# Patient Record
Sex: Female | Born: 1935 | Race: White | Hispanic: No | Smoking: Never smoker
Health system: Southern US, Community
[De-identification: ages and names within clinical notes are randomized; demographics above are authoritative.]

## PROBLEM LIST (undated history)

## (undated) DIAGNOSIS — I10 Essential (primary) hypertension: Secondary | ICD-10-CM

## (undated) DIAGNOSIS — G930 Cerebral cysts: Secondary | ICD-10-CM

## (undated) DIAGNOSIS — I509 Heart failure, unspecified: Secondary | ICD-10-CM

## (undated) DIAGNOSIS — I2510 Atherosclerotic heart disease of native coronary artery without angina pectoris: Secondary | ICD-10-CM

## (undated) DIAGNOSIS — I340 Nonrheumatic mitral (valve) insufficiency: Secondary | ICD-10-CM

## (undated) DIAGNOSIS — R0989 Other specified symptoms and signs involving the circulatory and respiratory systems: Secondary | ICD-10-CM

## (undated) DIAGNOSIS — E039 Hypothyroidism, unspecified: Secondary | ICD-10-CM

## (undated) HISTORY — DX: Hypothyroidism, unspecified: E03.9

## (undated) HISTORY — DX: Other specified symptoms and signs involving the circulatory and respiratory systems: R09.89

## (undated) HISTORY — PX: CRANIOTOMY: SHX93

## (undated) HISTORY — DX: Atherosclerotic heart disease of native coronary artery without angina pectoris: I25.10

## (undated) HISTORY — DX: Nonrheumatic mitral (valve) insufficiency: I34.0

## (undated) HISTORY — DX: Cerebral cysts: G93.0

## (undated) HISTORY — DX: Essential (primary) hypertension: I10

## (undated) HISTORY — DX: Heart failure, unspecified: I50.9

---

## 1998-06-03 ENCOUNTER — Other Ambulatory Visit: Admission: RE | Admit: 1998-06-03 | Discharge: 1998-06-03 | Payer: Self-pay | Admitting: Obstetrics and Gynecology

## 1999-02-05 ENCOUNTER — Encounter: Admission: RE | Admit: 1999-02-05 | Discharge: 1999-05-06 | Payer: Self-pay | Admitting: Internal Medicine

## 1999-07-14 ENCOUNTER — Other Ambulatory Visit: Admission: RE | Admit: 1999-07-14 | Discharge: 1999-07-14 | Payer: Self-pay | Admitting: Obstetrics and Gynecology

## 1999-09-03 ENCOUNTER — Encounter: Payer: Self-pay | Admitting: Obstetrics and Gynecology

## 1999-09-08 ENCOUNTER — Inpatient Hospital Stay (HOSPITAL_COMMUNITY): Admission: RE | Admit: 1999-09-08 | Discharge: 1999-09-10 | Payer: Self-pay | Admitting: Obstetrics and Gynecology

## 2001-12-01 ENCOUNTER — Other Ambulatory Visit: Admission: RE | Admit: 2001-12-01 | Discharge: 2001-12-01 | Payer: Self-pay | Admitting: Obstetrics & Gynecology

## 2002-12-19 ENCOUNTER — Other Ambulatory Visit: Admission: RE | Admit: 2002-12-19 | Discharge: 2002-12-19 | Payer: Self-pay | Admitting: Obstetrics and Gynecology

## 2004-01-02 ENCOUNTER — Encounter: Payer: Self-pay | Admitting: Internal Medicine

## 2004-10-31 ENCOUNTER — Encounter: Payer: Self-pay | Admitting: Internal Medicine

## 2005-07-16 ENCOUNTER — Encounter: Payer: Self-pay | Admitting: Internal Medicine

## 2005-08-18 ENCOUNTER — Encounter: Payer: Self-pay | Admitting: Internal Medicine

## 2006-01-08 ENCOUNTER — Encounter: Payer: Self-pay | Admitting: Internal Medicine

## 2007-03-16 ENCOUNTER — Encounter: Payer: Self-pay | Admitting: Internal Medicine

## 2007-03-23 ENCOUNTER — Encounter: Payer: Self-pay | Admitting: Internal Medicine

## 2007-08-19 ENCOUNTER — Encounter: Payer: Self-pay | Admitting: Internal Medicine

## 2007-08-26 ENCOUNTER — Encounter: Payer: Self-pay | Admitting: Internal Medicine

## 2007-08-26 ENCOUNTER — Encounter: Admission: RE | Admit: 2007-08-26 | Discharge: 2007-08-26 | Payer: Self-pay | Admitting: Internal Medicine

## 2007-08-31 ENCOUNTER — Encounter: Payer: Self-pay | Admitting: Internal Medicine

## 2007-09-28 ENCOUNTER — Inpatient Hospital Stay (HOSPITAL_COMMUNITY): Admission: RE | Admit: 2007-09-28 | Discharge: 2007-10-05 | Payer: Self-pay | Admitting: Neurological Surgery

## 2007-10-04 ENCOUNTER — Ambulatory Visit: Payer: Self-pay | Admitting: Physical Medicine & Rehabilitation

## 2007-10-05 ENCOUNTER — Inpatient Hospital Stay (HOSPITAL_COMMUNITY)
Admission: RE | Admit: 2007-10-05 | Discharge: 2007-10-25 | Payer: Self-pay | Admitting: Physical Medicine & Rehabilitation

## 2007-10-25 ENCOUNTER — Inpatient Hospital Stay (HOSPITAL_COMMUNITY): Admission: RE | Admit: 2007-10-25 | Discharge: 2007-10-31 | Payer: Self-pay | Admitting: Neurological Surgery

## 2007-11-24 ENCOUNTER — Encounter
Admission: RE | Admit: 2007-11-24 | Discharge: 2007-12-23 | Payer: Self-pay | Admitting: Physical Medicine & Rehabilitation

## 2007-11-25 ENCOUNTER — Ambulatory Visit: Payer: Self-pay | Admitting: Physical Medicine & Rehabilitation

## 2007-11-28 ENCOUNTER — Encounter: Admission: RE | Admit: 2007-11-28 | Discharge: 2007-11-28 | Payer: Self-pay | Admitting: Neurological Surgery

## 2007-12-13 ENCOUNTER — Encounter
Admission: RE | Admit: 2007-12-13 | Discharge: 2007-12-14 | Payer: Self-pay | Admitting: Physical Medicine & Rehabilitation

## 2007-12-14 ENCOUNTER — Encounter
Admission: RE | Admit: 2007-12-14 | Discharge: 2008-01-09 | Payer: Self-pay | Admitting: Physical Medicine & Rehabilitation

## 2007-12-23 ENCOUNTER — Ambulatory Visit: Payer: Self-pay | Admitting: Physical Medicine & Rehabilitation

## 2008-01-03 ENCOUNTER — Encounter: Admission: RE | Admit: 2008-01-03 | Discharge: 2008-01-03 | Payer: Self-pay | Admitting: Neurological Surgery

## 2008-01-06 ENCOUNTER — Encounter: Admission: RE | Admit: 2008-01-06 | Discharge: 2008-04-05 | Payer: Self-pay | Admitting: Neurological Surgery

## 2008-02-24 ENCOUNTER — Encounter: Payer: Self-pay | Admitting: Internal Medicine

## 2008-03-01 ENCOUNTER — Encounter: Payer: Self-pay | Admitting: Internal Medicine

## 2008-04-05 ENCOUNTER — Encounter: Admission: RE | Admit: 2008-04-05 | Discharge: 2008-04-26 | Payer: Self-pay | Admitting: Anesthesiology

## 2008-08-01 DIAGNOSIS — I10 Essential (primary) hypertension: Secondary | ICD-10-CM | POA: Insufficient documentation

## 2008-08-01 DIAGNOSIS — G930 Cerebral cysts: Secondary | ICD-10-CM | POA: Insufficient documentation

## 2008-08-01 DIAGNOSIS — E039 Hypothyroidism, unspecified: Secondary | ICD-10-CM | POA: Insufficient documentation

## 2008-08-02 ENCOUNTER — Ambulatory Visit: Payer: Self-pay | Admitting: Internal Medicine

## 2008-08-02 ENCOUNTER — Encounter: Payer: Self-pay | Admitting: Internal Medicine

## 2008-08-02 DIAGNOSIS — R012 Other cardiac sounds: Secondary | ICD-10-CM | POA: Insufficient documentation

## 2008-08-02 DIAGNOSIS — E785 Hyperlipidemia, unspecified: Secondary | ICD-10-CM | POA: Insufficient documentation

## 2008-08-02 DIAGNOSIS — R0989 Other specified symptoms and signs involving the circulatory and respiratory systems: Secondary | ICD-10-CM | POA: Insufficient documentation

## 2008-08-02 DIAGNOSIS — R011 Cardiac murmur, unspecified: Secondary | ICD-10-CM | POA: Insufficient documentation

## 2008-08-02 DIAGNOSIS — R9431 Abnormal electrocardiogram [ECG] [EKG]: Secondary | ICD-10-CM | POA: Insufficient documentation

## 2008-08-17 ENCOUNTER — Ambulatory Visit: Payer: Self-pay

## 2008-08-20 ENCOUNTER — Telehealth (INDEPENDENT_AMBULATORY_CARE_PROVIDER_SITE_OTHER): Payer: Self-pay | Admitting: *Deleted

## 2008-08-21 ENCOUNTER — Encounter: Payer: Self-pay | Admitting: Internal Medicine

## 2008-08-21 ENCOUNTER — Ambulatory Visit: Payer: Self-pay

## 2008-08-24 ENCOUNTER — Telehealth: Payer: Self-pay | Admitting: Internal Medicine

## 2008-08-24 ENCOUNTER — Encounter (INDEPENDENT_AMBULATORY_CARE_PROVIDER_SITE_OTHER): Payer: Self-pay

## 2008-08-24 ENCOUNTER — Encounter: Payer: Self-pay | Admitting: Internal Medicine

## 2008-08-27 ENCOUNTER — Ambulatory Visit: Payer: Self-pay | Admitting: Internal Medicine

## 2008-08-27 DIAGNOSIS — R079 Chest pain, unspecified: Secondary | ICD-10-CM | POA: Insufficient documentation

## 2008-08-28 ENCOUNTER — Ambulatory Visit: Payer: Self-pay | Admitting: Cardiology

## 2008-08-28 ENCOUNTER — Inpatient Hospital Stay (HOSPITAL_BASED_OUTPATIENT_CLINIC_OR_DEPARTMENT_OTHER): Admission: RE | Admit: 2008-08-28 | Discharge: 2008-08-28 | Payer: Self-pay | Admitting: Cardiology

## 2008-08-28 LAB — CONVERTED CEMR LAB
Basophils Absolute: 0 10*3/uL (ref 0.0–0.1)
Basophils Relative: 0.5 % (ref 0.0–3.0)
CO2: 30 meq/L (ref 19–32)
Calcium: 9.6 mg/dL (ref 8.4–10.5)
Chloride: 107 meq/L (ref 96–112)
Eosinophils Absolute: 0.1 10*3/uL (ref 0.0–0.7)
Glucose, Bld: 181 mg/dL — ABNORMAL HIGH (ref 70–99)
Hemoglobin: 13 g/dL (ref 12.0–15.0)
Lymphocytes Relative: 28.9 % (ref 12.0–46.0)
Lymphs Abs: 1.3 10*3/uL (ref 0.7–4.0)
MCHC: 35.6 g/dL (ref 30.0–36.0)
MCV: 89.8 fL (ref 78.0–100.0)
Monocytes Absolute: 0.4 10*3/uL (ref 0.1–1.0)
Neutro Abs: 2.6 10*3/uL (ref 1.4–7.7)
RBC: 4.08 M/uL (ref 3.87–5.11)
RDW: 13 % (ref 11.5–14.6)
Sodium: 142 meq/L (ref 135–145)

## 2008-09-11 ENCOUNTER — Ambulatory Visit: Payer: Self-pay | Admitting: Internal Medicine

## 2008-09-11 LAB — CONVERTED CEMR LAB
BUN: 12 mg/dL (ref 6–23)
CO2: 32 meq/L (ref 19–32)
Calcium: 9.9 mg/dL (ref 8.4–10.5)
Creatinine, Ser: 0.8 mg/dL (ref 0.4–1.2)
GFR calc non Af Amer: 74.74 mL/min (ref >60)
Glucose, Bld: 187 mg/dL — ABNORMAL HIGH (ref 70–99)

## 2008-09-13 ENCOUNTER — Ambulatory Visit: Payer: Self-pay | Admitting: Internal Medicine

## 2008-09-13 DIAGNOSIS — I509 Heart failure, unspecified: Secondary | ICD-10-CM | POA: Insufficient documentation

## 2008-09-17 LAB — CONVERTED CEMR LAB
BUN: 14 mg/dL (ref 6–23)
Calcium: 10 mg/dL (ref 8.4–10.5)
Chloride: 103 meq/L (ref 96–112)
Creatinine, Ser: 0.7 mg/dL (ref 0.4–1.2)
Pro B Natriuretic peptide (BNP): 67 pg/mL (ref 0.0–100.0)

## 2008-12-23 ENCOUNTER — Inpatient Hospital Stay (HOSPITAL_COMMUNITY): Admission: EM | Admit: 2008-12-23 | Discharge: 2008-12-29 | Payer: Self-pay | Admitting: Emergency Medicine

## 2008-12-23 ENCOUNTER — Ambulatory Visit: Payer: Self-pay | Admitting: Cardiology

## 2008-12-27 ENCOUNTER — Encounter (INDEPENDENT_AMBULATORY_CARE_PROVIDER_SITE_OTHER): Payer: Self-pay | Admitting: General Surgery

## 2009-01-11 ENCOUNTER — Ambulatory Visit: Payer: Self-pay | Admitting: Internal Medicine

## 2009-01-16 LAB — CONVERTED CEMR LAB
CO2: 29 meq/L (ref 19–32)
Calcium: 9.3 mg/dL (ref 8.4–10.5)
Creatinine, Ser: 0.8 mg/dL (ref 0.4–1.2)
GFR calc non Af Amer: 74.67 mL/min (ref >60)
Sodium: 140 meq/L (ref 135–145)

## 2009-02-28 ENCOUNTER — Encounter: Payer: Self-pay | Admitting: Internal Medicine

## 2009-03-01 ENCOUNTER — Encounter: Payer: Self-pay | Admitting: Internal Medicine

## 2009-03-01 ENCOUNTER — Ambulatory Visit: Payer: Self-pay

## 2009-03-14 ENCOUNTER — Telehealth: Payer: Self-pay | Admitting: Internal Medicine

## 2009-03-14 ENCOUNTER — Encounter: Payer: Self-pay | Admitting: Internal Medicine

## 2009-03-25 ENCOUNTER — Ambulatory Visit: Payer: Self-pay | Admitting: Internal Medicine

## 2009-03-25 DIAGNOSIS — R109 Unspecified abdominal pain: Secondary | ICD-10-CM | POA: Insufficient documentation

## 2009-03-28 ENCOUNTER — Telehealth: Payer: Self-pay | Admitting: Internal Medicine

## 2009-03-29 LAB — CONVERTED CEMR LAB
Basophils Relative: 1.2 % (ref 0.0–3.0)
Bilirubin Urine: NEGATIVE
Chloride: 99 meq/L (ref 96–112)
Cholesterol: 109 mg/dL (ref 0–200)
Eosinophils Absolute: 0.1 10*3/uL (ref 0.0–0.7)
Eosinophils Relative: 0.5 % (ref 0.0–5.0)
GFR calc non Af Amer: 104.02 mL/min (ref >60)
HDL: 28.4 mg/dL — ABNORMAL LOW (ref >39.00)
LDL Cholesterol: 51 mg/dL (ref 0–99)
Lymphocytes Relative: 11.1 % — ABNORMAL LOW (ref 12.0–46.0)
MCHC: 33.9 g/dL (ref 30.0–36.0)
MCV: 88.1 fL (ref 78.0–100.0)
Monocytes Absolute: 0.4 10*3/uL (ref 0.1–1.0)
Neutrophils Relative %: 82.9 % — ABNORMAL HIGH (ref 43.0–77.0)
Platelets: 317 10*3/uL (ref 150.0–400.0)
Potassium: 4.5 meq/L (ref 3.5–5.1)
RBC: 3.8 M/uL — ABNORMAL LOW (ref 3.87–5.11)
Sodium: 136 meq/L (ref 135–145)
Specific Gravity, Urine: 1.025 (ref 1.000–1.030)
Total Protein, Urine: NEGATIVE mg/dL
Urine Glucose: 250 mg/dL
VLDL: 29.6 mg/dL (ref 0.0–40.0)
WBC: 10.2 10*3/uL (ref 4.5–10.5)
pH: 5.5 (ref 5.0–8.0)

## 2009-04-03 ENCOUNTER — Ambulatory Visit (HOSPITAL_COMMUNITY): Admission: RE | Admit: 2009-04-03 | Discharge: 2009-04-03 | Payer: Self-pay | Admitting: General Surgery

## 2009-04-09 ENCOUNTER — Ambulatory Visit (HOSPITAL_COMMUNITY): Admission: RE | Admit: 2009-04-09 | Discharge: 2009-04-09 | Payer: Self-pay | Admitting: Internal Medicine

## 2009-04-09 ENCOUNTER — Encounter: Payer: Self-pay | Admitting: Internal Medicine

## 2009-04-09 ENCOUNTER — Ambulatory Visit: Payer: Self-pay | Admitting: Cardiovascular Disease

## 2009-04-09 ENCOUNTER — Ambulatory Visit: Payer: Self-pay | Admitting: Internal Medicine

## 2009-04-09 ENCOUNTER — Ambulatory Visit: Payer: Self-pay

## 2009-04-10 ENCOUNTER — Encounter: Admission: RE | Admit: 2009-04-10 | Discharge: 2009-04-10 | Payer: Self-pay | Admitting: Neurological Surgery

## 2009-04-18 LAB — CONVERTED CEMR LAB
CO2: 30 meq/L (ref 19–32)
Chloride: 100 meq/L (ref 96–112)
GFR calc non Af Amer: 87.06 mL/min (ref >60)
Glucose, Bld: 312 mg/dL — ABNORMAL HIGH (ref 70–99)
Potassium: 4 meq/L (ref 3.5–5.1)
Sodium: 138 meq/L (ref 135–145)

## 2009-05-21 ENCOUNTER — Encounter (INDEPENDENT_AMBULATORY_CARE_PROVIDER_SITE_OTHER): Payer: Self-pay | Admitting: *Deleted

## 2009-06-12 ENCOUNTER — Telehealth (INDEPENDENT_AMBULATORY_CARE_PROVIDER_SITE_OTHER): Payer: Self-pay | Admitting: *Deleted

## 2009-07-08 ENCOUNTER — Telehealth (INDEPENDENT_AMBULATORY_CARE_PROVIDER_SITE_OTHER): Payer: Self-pay | Admitting: *Deleted

## 2009-07-19 ENCOUNTER — Encounter: Admission: RE | Admit: 2009-07-19 | Discharge: 2009-07-19 | Payer: Self-pay | Admitting: General Surgery

## 2009-07-19 ENCOUNTER — Inpatient Hospital Stay (HOSPITAL_COMMUNITY): Admission: EM | Admit: 2009-07-19 | Discharge: 2009-07-20 | Payer: Self-pay | Admitting: Emergency Medicine

## 2009-09-05 ENCOUNTER — Encounter: Admission: RE | Admit: 2009-09-05 | Discharge: 2009-09-05 | Payer: Self-pay | Admitting: General Surgery

## 2009-09-16 ENCOUNTER — Ambulatory Visit (HOSPITAL_COMMUNITY): Admission: RE | Admit: 2009-09-16 | Discharge: 2009-09-16 | Payer: Self-pay | Admitting: General Surgery

## 2009-10-14 ENCOUNTER — Telehealth: Payer: Self-pay | Admitting: Internal Medicine

## 2010-03-26 ENCOUNTER — Telehealth: Payer: Self-pay | Admitting: Cardiology

## 2010-03-31 ENCOUNTER — Encounter: Payer: Self-pay | Admitting: Internal Medicine

## 2010-03-31 DIAGNOSIS — I6529 Occlusion and stenosis of unspecified carotid artery: Secondary | ICD-10-CM | POA: Insufficient documentation

## 2010-04-01 ENCOUNTER — Encounter: Payer: Self-pay | Admitting: Internal Medicine

## 2010-04-01 ENCOUNTER — Ambulatory Visit: Payer: Self-pay

## 2010-04-30 IMAGING — CR DG CHEST 2V
1 series · 1 of 1 positions shown · non-contrast
Comparison: Chest x-ray of 09/28/2007

CLINICAL DATA: Short of breath

CHEST - 2 VIEW

[view not recorded]
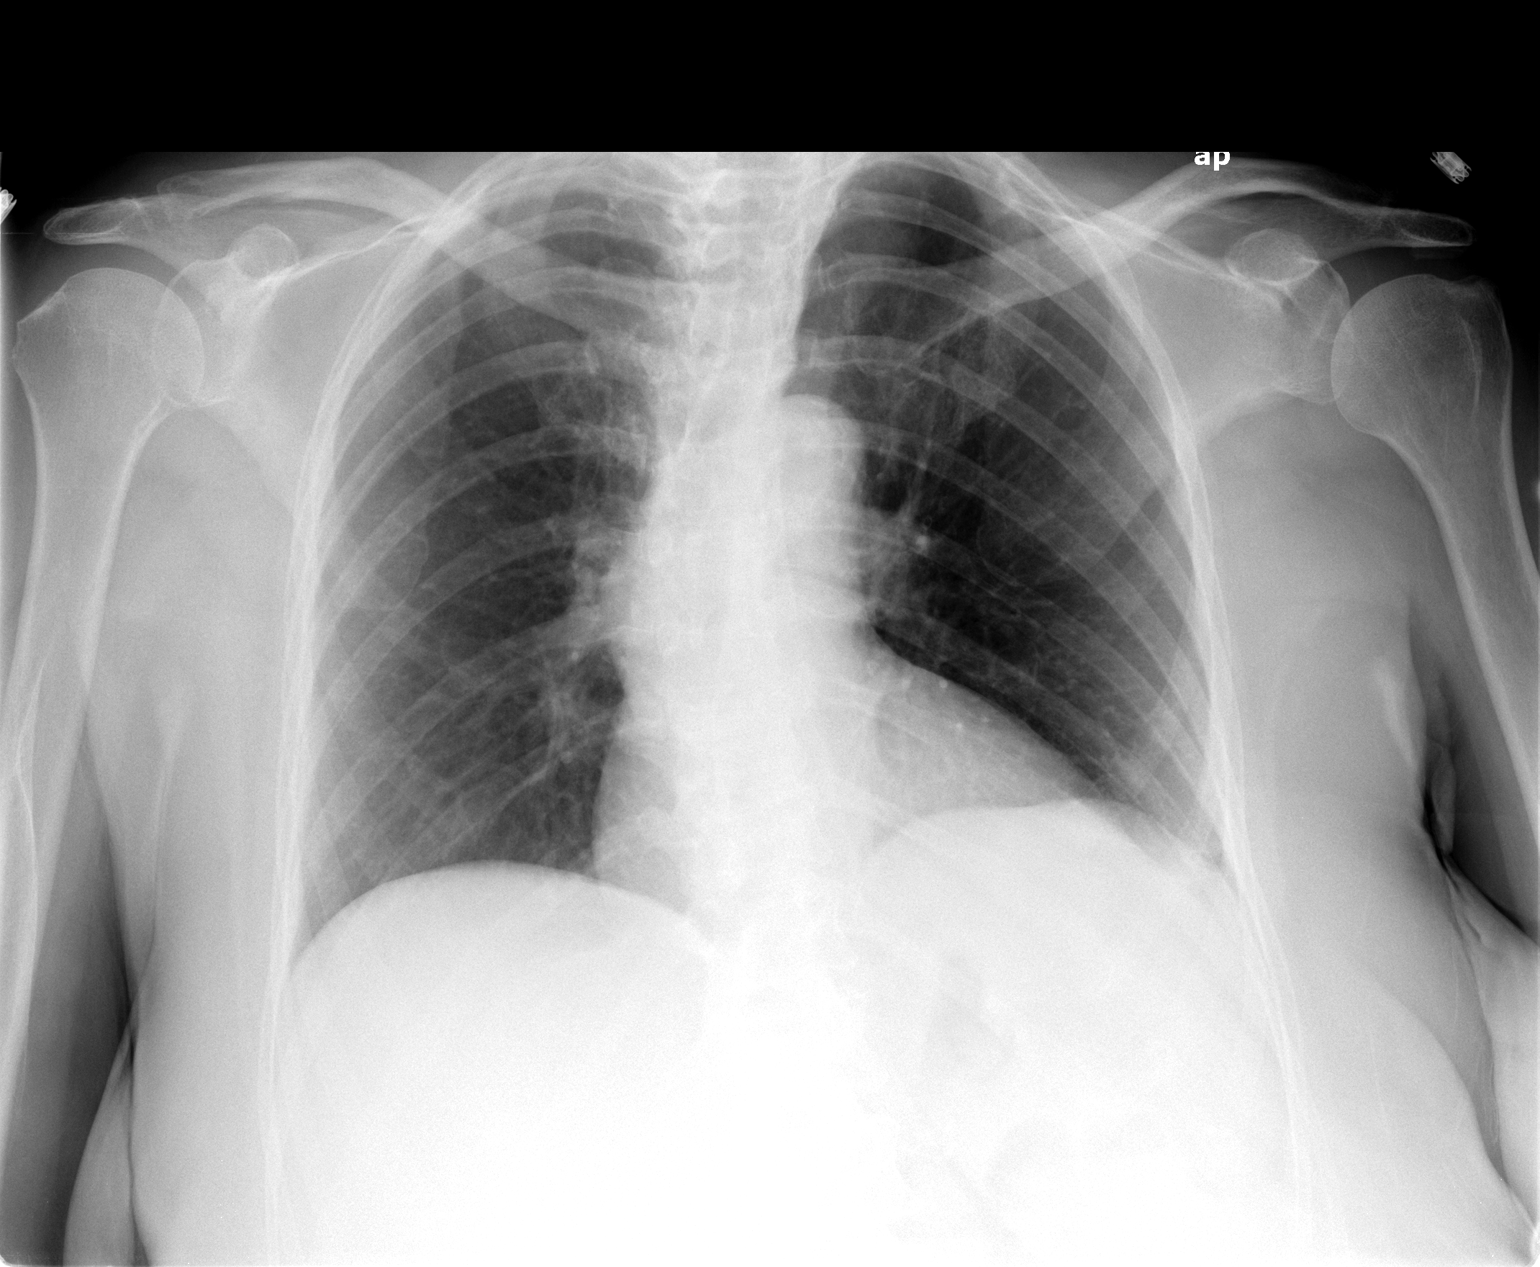

[1 of 1 positions shown; findings below may reference images not displayed]

FINDINGS: The lungs are clear.  Heart is within upper limits of
normal.  Mild degenerative changes noted throughout the thoracic
spine.
IMPRESSION: No active lung disease.

## 2010-05-09 ENCOUNTER — Encounter: Payer: Self-pay | Admitting: Internal Medicine

## 2010-05-09 ENCOUNTER — Ambulatory Visit
Admission: RE | Admit: 2010-05-09 | Discharge: 2010-05-09 | Payer: Self-pay | Source: Home / Self Care | Attending: Internal Medicine | Admitting: Internal Medicine

## 2010-05-21 IMAGING — CT CT HEAD W/O CM
1 series · 16 of 30 positions shown, 20 images · non-contrast
Comparison: 10/21/2007 and multiple previous films

CLINICAL DATA: Follow-up subdural after cyst removal

CT HEAD WITHOUT CONTRAST
TECHNIQUE: Contiguous axial images were obtained from the base of
the skull through the vertex without contrast.

[Series 2: head routine 4.8 h37s · axial · 0.44mm/px · z∈[-579,-444]mm · 16 of 30 slices shown, 20 images]
[im 2/30  brain]
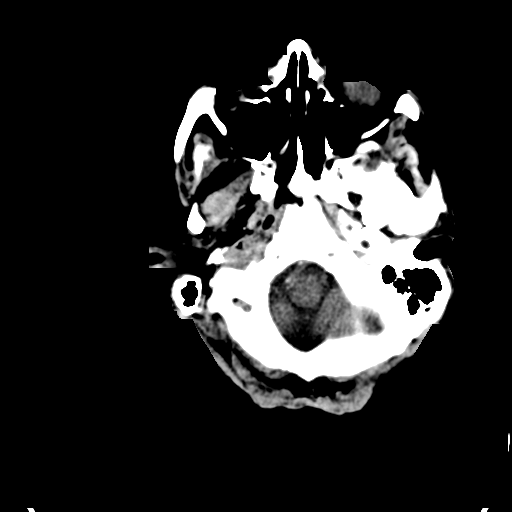
[im 2/30  bone]
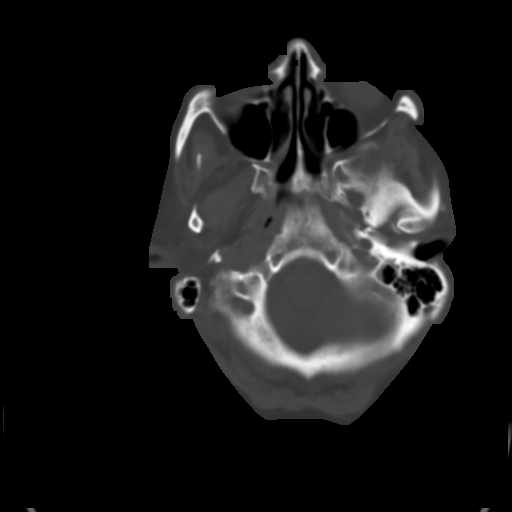
[im 4/30  brain]
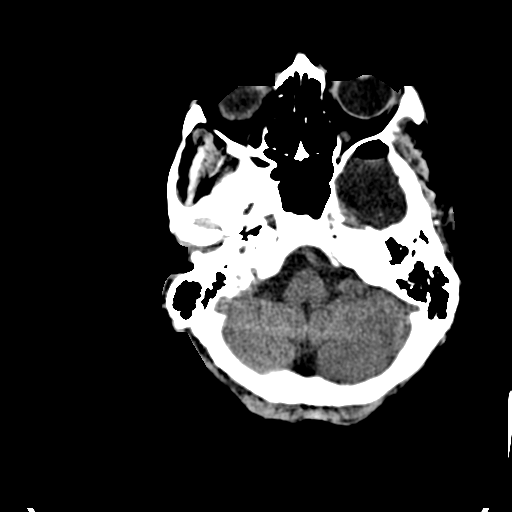
[im 6/30  brain]
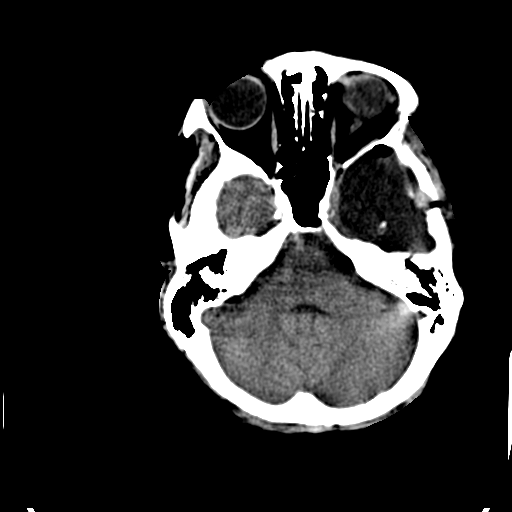
[im 8/30  brain]
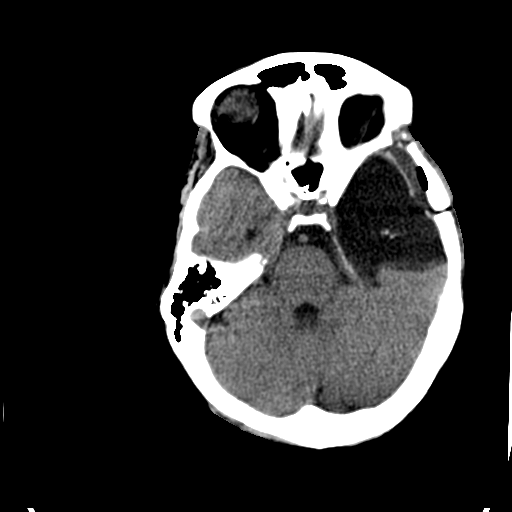
[im 9/30  brain]
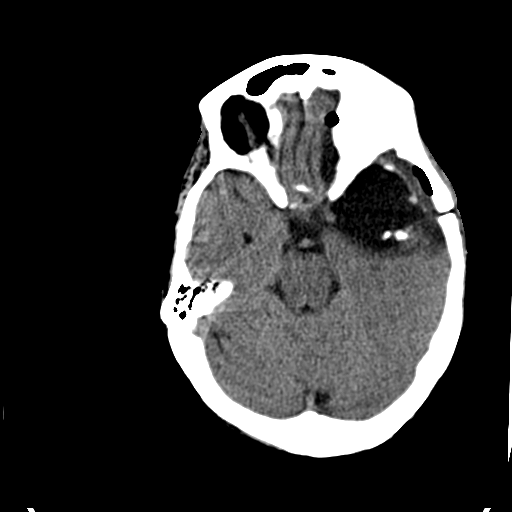
[im 9/30  bone]
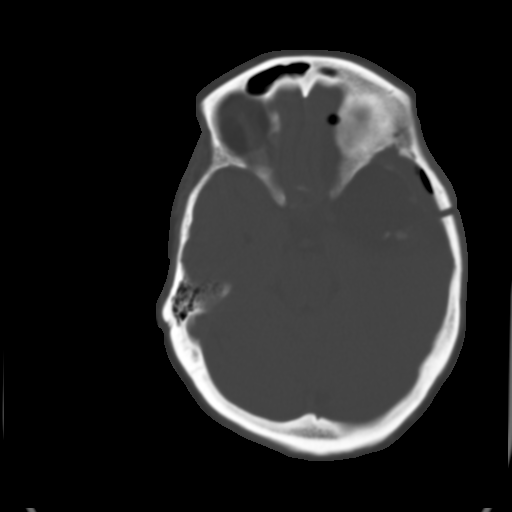
[im 11/30  brain]
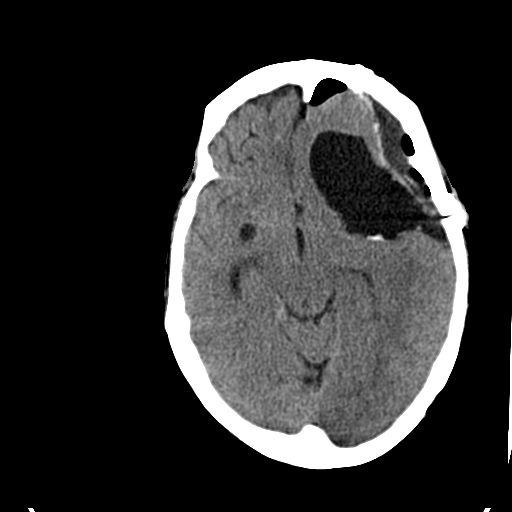
[im 13/30  brain]
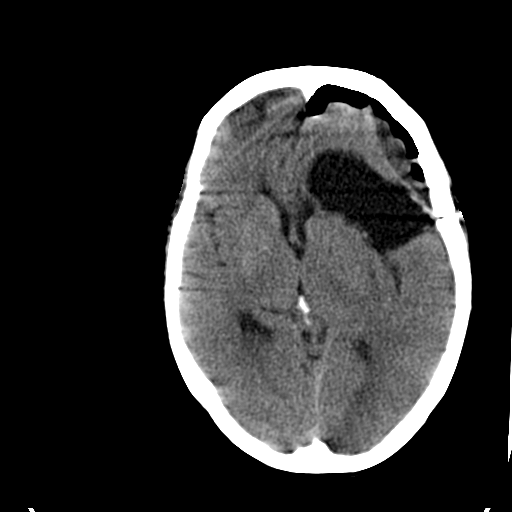
[im 15/30  brain]
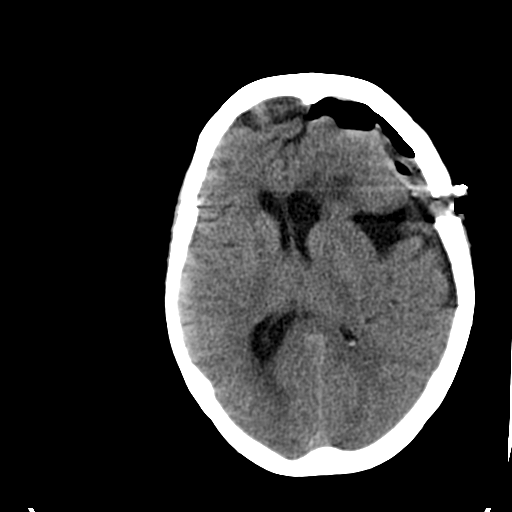
[im 16/30  brain]
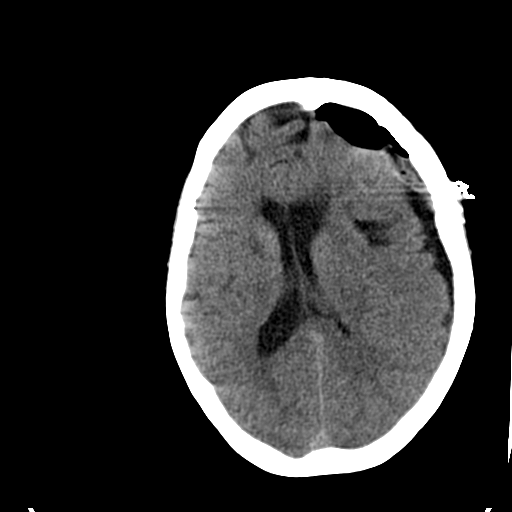
[im 16/30  bone]
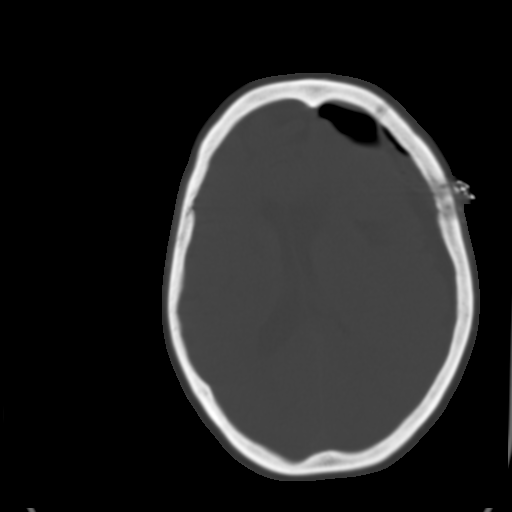
[im 18/30  brain]
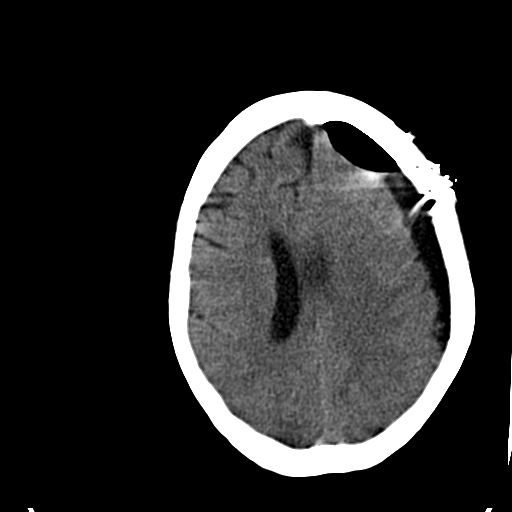
[im 20/30  brain]
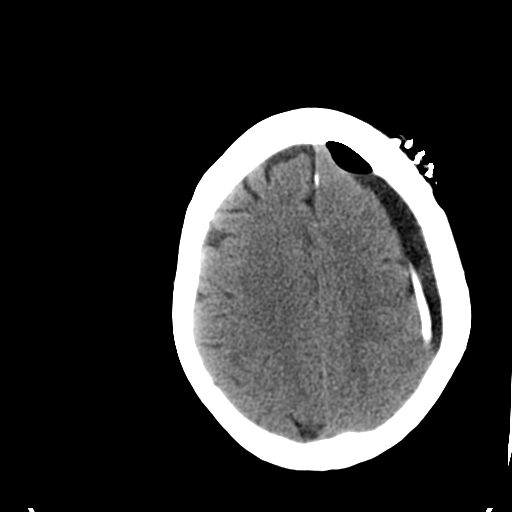
[im 22/30  brain]
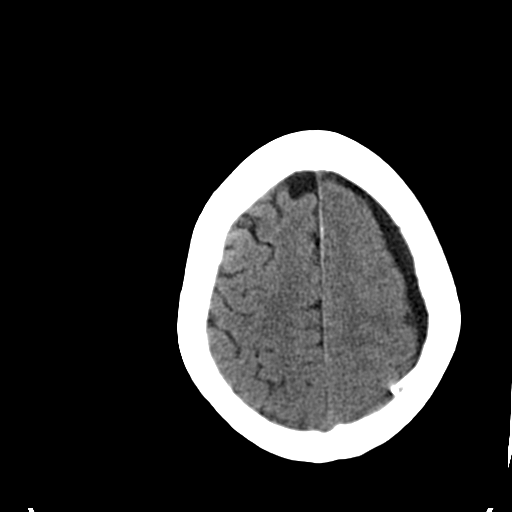
[im 23/30  brain]
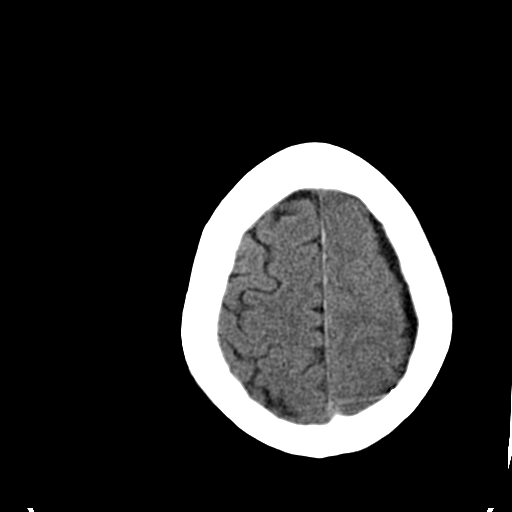
[im 23/30  bone]
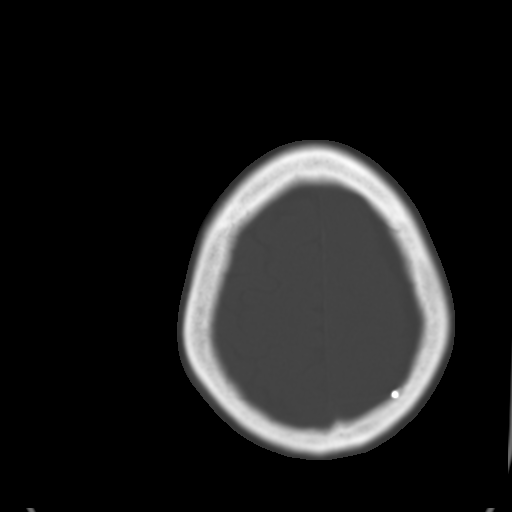
[im 25/30  brain]
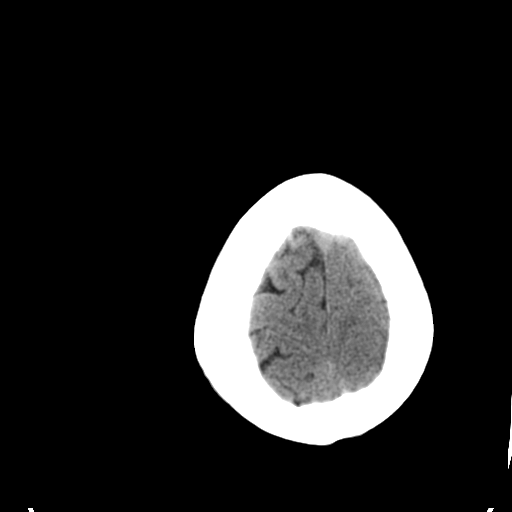
[im 27/30  brain]
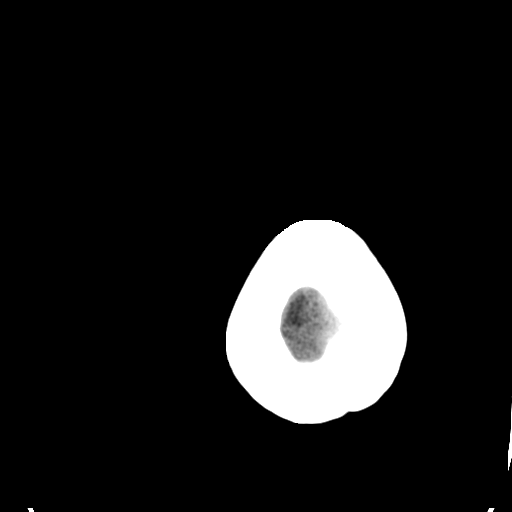
[im 29/30  brain]
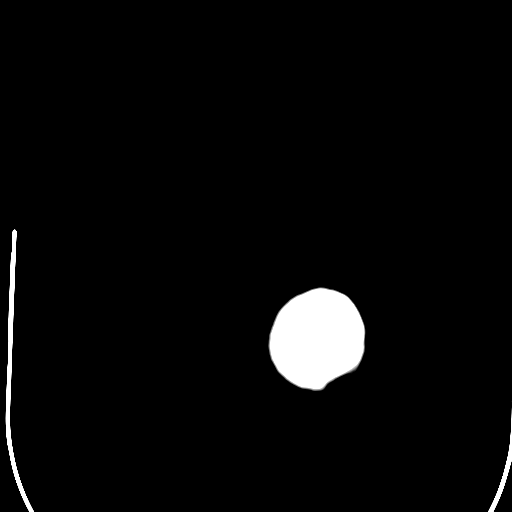

[16 of 30 positions shown; findings below may reference images not displayed]

FINDINGS: .  Left frontal subdural drain is in place apparently
well positioned.  Left frontal extra-axial fluid in the year
persists, similar in degree to the previous exam.  This is slightly
entered.  Previously that this was measured at 14.5 mm and now
measures 11 mm.  Middle cranial fossa cyst appears the same.
Remainder the brain appears the same.  Midline shift previously
measured at 12.6 mm now measures 11 mm.  No new complicating
feature.
IMPRESSION: Subdural drain replaced.  Slight decrease in thickness of subdural
fluid on the left.  Some subdural air present.  Less midline shift
by a millimeter.

## 2010-05-27 NOTE — Progress Notes (Signed)
Summary: rx refill  Phone Note Refill Request Message from:  Patient on March 26, 2010 2:44 PM  Refills Requested: Medication #1:  POTASSIUM CHLORIDE CRYS CR 20 MEQ CR-TABS Take one tablet by mouth daily [facility]# [phone]   Method Requested: Telephone to Pharmacy Initial call taken by: Roe Coombs,  March 26, 2010 2:45 PM  Follow-up for Phone Call       Follow-up by: Judithe Modest CMA,  March 26, 2010 2:46 PM    Prescriptions: POTASSIUM CHLORIDE CRYS CR 20 MEQ CR-TABS (POTASSIUM CHLORIDE CRYS CR) Take one tablet by mouth daily  #30 Each x 6   Entered by:   Judithe Modest CMA   Authorized by:   Marca Ancona, MD   Signed by:   Judithe Modest CMA on 03/26/2010   Method used:   Electronically to        [facility] [phone]* (retail)       [address]       [place], [place]  [address]       Ph: [phone]       Fax: [phone]   RxID:   [id number]

## 2010-05-27 NOTE — Progress Notes (Signed)
  Phone Note From Other Clinic   Caller: [facility] Details for Reason: Pt.Informatin Initial call taken by: Ivery Quale over to [facility] @ [phone] [facility]  July 08, 2009 10:28 AM'

## 2010-05-27 NOTE — Letter (Signed)
Summary: Appointment - Reminder 2  [facility], Main Office  [address]   [place], [place] [address]   Phone: [phone]  Fax: [phone]     May 21, 2009 MRN: [id number]   [name] [address] [place], [place]  [address]   Dear Ms. Bare,  Our records indicate that it is time to schedule a follow-up appointment with Dr. Tenny Craw. It is very important that we reach you to schedule this appointment. We look forward to participating in your health care needs. Please contact us at the number listed above at your earliest convenience to schedule your appointment.  If you are unable to make an appointment at this time, give [place] a call so we can update our records.  Sincerely,    [facility] Scheduling Team

## 2010-05-27 NOTE — Progress Notes (Signed)
Summary: refill  Phone Note Refill Request Message from:  Patient on October 14, 2009 11:02 AM  Refills Requested: Medication #1:  CARVEDILOL 6.25 MG TABS Take one tablet by mouth twice a day  Medication #2:  LISINOPRIL 5 MG TABS Take one tablet by mouth daily  Medication #3:  SIMVASTATIN 80 MG TABS once daily  Medication #4:  FUROSEMIDE 20 MG TABS Take one tablet by mouth daily. Send to [address] [phone] pt only have three pill left need right away  Initial call taken by: Judie Grieve,  October 14, 2009 11:03 AM    Prescriptions: FUROSEMIDE 20 MG TABS (FUROSEMIDE) Take one tablet by mouth daily.  #30 Each x 5   Entered by:   Hardin Negus, RMA   Authorized by:   Sherrill Raring, MD, [facility]   Signed by:   Hardin Negus, RMA on 10/14/2009   Method used:   Electronically to        [facility] [phone]* (retail)       [address]       [place], [place]  [address]       Ph: [phone]       Fax: [phone]   RxID:   [id number] LISINOPRIL 5 MG TABS (LISINOPRIL) Take one tablet by mouth daily  #30 x 5   Entered by:   Hardin Negus, RMA   Authorized by:   Sherrill Raring, MD, [facility]   Signed by:   Hardin Negus, RMA on 10/14/2009   Method used:   Electronically to        [facility] [phone]* (retail)       [address]       [place], [place]  [address]       Ph: [phone]       Fax: [phone]   RxID:   [id number] CARVEDILOL 6.25 MG TABS (CARVEDILOL) Take one tablet by mouth twice a day  #60 x 5   Entered by:   Hardin Negus, RMA   Authorized by:   Sherrill Raring, MD, [facility]   Signed by:   Hardin Negus, RMA on 10/14/2009   Method used:   Electronically to        [facility] [phone]* (retail)       [address]       [place], [place]  [address]       Ph: [phone]       Fax: [phone]   RxID:   [id number] SIMVASTATIN 80 MG TABS (SIMVASTATIN) once daily  #30 x 5   Entered by:   Hardin Negus, RMA  Authorized by:   Sherrill Raring, MD, [facility]   Signed by:   Hardin Negus, RMA on 10/14/2009   Method used:   Electronically to        [facility] [phone]* (retail)       [address]       [place], [place]  [address]       Ph: [phone]       Fax: [phone]   RxID:   [id number]

## 2010-05-27 NOTE — Progress Notes (Signed)
Summary: REFILL  Phone Note Refill Request Message from:  Patient on June 12, 2009 4:47 PM  Refills Requested: Medication #1:  POTASSIUM CHLORIDE CRYS CR 20 MEQ CR-TABS Take one tablet by mouth daily  Medication #2:  CARVEDILOL 6.25 MG TABS Take one tablet by mouth twice a day  Medication #3:  LISINOPRIL 5 MG TABS Take one tablet by mouth daily [facility] [id number]  Initial call taken by: Judie Grieve,  June 12, 2009 4:48 PM  Follow-up for Phone Call        Rx faxed to pharmacy Follow-up by: Oswald Hillock,  June 13, 2009 10:29 AM    Prescriptions: POTASSIUM CHLORIDE CRYS CR 20 MEQ CR-TABS (POTASSIUM CHLORIDE CRYS CR) Take one tablet by mouth daily  #90 x 0   Entered by:   Oswald Hillock   Authorized by:   Sherrill Raring, MD, [facility]   Signed by:   Oswald Hillock on 06/13/2009   Method used:   Electronically to        [facility]* (mail-order)       [address]       [place], [place]  [address]       Ph: [phone]       Fax: [phone]   RxID:   [id number] LISINOPRIL 5 MG TABS (LISINOPRIL) Take one tablet by mouth daily  #90 x 0   Entered by:   Oswald Hillock   Authorized by:   Sherrill Raring, MD, [facility]   Signed by:   Oswald Hillock on 06/13/2009   Method used:   Electronically to        [facility]* (mail-order)       [address]       [place], [place]  [address]       Ph: [phone]       Fax: [phone]   RxID:   [id number] CARVEDILOL 6.25 MG TABS (CARVEDILOL) Take one tablet by mouth twice a day  #180 x 0   Entered by:   Oswald Hillock   Authorized by:   Sherrill Raring, MD, [facility]   Signed by:   Oswald Hillock on 06/13/2009   Method used:   Electronically to        [facility]* (mail-order)       [address], [place]  [address]       Ph: [phone]       Fax: [phone]   RxID:    [id number]

## 2010-05-27 NOTE — Miscellaneous (Signed)
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 

## 2010-05-29 NOTE — Assessment & Plan Note (Signed)
Summary: last ov 02/2009   Visit Type:  Follow-up. Last ov 2010 Referring Provider:  Dr. Arelia Sneddon Primary Provider:  Dr. Jacky Kindle   History of Present Illness: Patient is a 77 year old with a history of CHF (LVEF 35%), moderate MR, mild CAD by cath in May of this year and.dyslipidemia.   She was last seen in 2010 SInce seen she has been doing fairly well.  She denies chest pain.  No SOB.  No dizziness.  She is followed by Dr. Jacky Kindle in clinic.  Current Medications (verified): 1)  Synthroid 100 Mcg Tabs (Levothyroxine Sodium) .Marland Kitchen.. 1 Tab Once Daily 2)  Simvastatin 80 Mg Tabs (Simvastatin) .... Once Daily 3)  Alprazolam 0.25 Mg Tabs (Alprazolam) .... 1/2 Tab Prn 4)  Glucovance 5-500 Mg Tabs (Glyburide-Metformin) .... Take 1 Two Times A Day 5)  Carvedilol 6.25 Mg Tabs (Carvedilol) .... Take One Tablet By Mouth Twice A Day 6)  Lisinopril 5 Mg Tabs (Lisinopril) .... Take One Tablet By Mouth Daily 7)  Furosemide 20 Mg Tabs (Furosemide) .... Take One Tablet By Mouth Daily. 8)  Potassium Chloride Crys Cr 20 Meq Cr-Tabs (Potassium Chloride Crys Cr) .... Take One Tablet By Mouth Daily 9)  Fish Oil   Oil (Fish Oil) .... Sometimes 10)  Cinnamon Vitamin .Marland Kitchen.. 1 Tab Once Daily 11)  Multivitamins  Tabs (Multiple Vitamin) .... Take 1 Tablet By Mouth Once A Day  Allergies (verified): No Known Drug Allergies  Past History:  Past medical, surgical, family and social histories (including risk factors) reviewed, and no changes noted (except as noted below).  Past Medical History: Reviewed history from 03/25/2009 and no changes required.  UNSPECIFIED HYPOTHYROIDISM (ICD-244.9) * DIABETES MELLITUS ARACHNOID CYST (ICD-348.0) ESSENTIAL HYPERTENSION, BENIGN (ICD-401.1) Carotid bruit LBBB CHF CAD Mitral regurgitation.  Past Surgical History: Reviewed history from 08/01/2008 and no changes required. Craniotomy for a left- sided arachnoid cyst. Hysterectomy in 1983 and a cyst removed from the sacrum  in 1952.  Family History: Reviewed history from 08/01/2008 and no changes required. FH Positive for CAD  Social History: Reviewed history from 08/01/2008 and no changes required. Widow Tobacco Use - No.  Alcohol Use - no Drug Use - no  Review of Systems       All systems reviewed.  NEg to the above problem.  Vital Signs:  Patient profile:   77 year old female Height:      63 inches Weight:      145.25 pounds BMI:     25.82 Pulse rate:   72 / minute Pulse rhythm:   regular Resp:     18 per minute BP sitting:   110 / 60  (left arm) Cuff size:   large  Vitals Entered By: Vikki Ports (May 09, 2010 10:06 AM)  Physical Exam  Additional Exam:  patient is in AND HEENT:  Normocephalic, atraumatic. EOMI, PERRLA.  Neck: JVP is normal.  Lungs: clear to auscultation. No rales no wheezes.  Heart: Regular rate and rhythm. Normal S1, S2. No S3.   No significant murmurs. PMI not displaced.  Abdomen:  Supple, nontender. Normal bowel sounds. No masses. No hepatomegaly.  Extremities:   Good distal pulses throughout. No lower extremity edema.  Musculoskeletal :moving all extremities.  Neuro:   alert and oriented x3.    EKG  Procedure date:  05/09/2010  Findings:      NSR>  71 bpm.  LBBB.  Impression & Recommendations:  Problem # 1:  CHF (ICD-428.0) Volume looks good.  I would keep on same regimen.  BP is OK  Problem # 2:  HYPERLIPIDEMIA-MIXED (ICD-272.4) Will need to review labs from [facility].  Tolerating simvistatiin.  Keep on current dose Her updated medication list for this problem includes:    Simvastatin 80 Mg Tabs (Simvastatin) ..... Once daily  Problem # 3:  ESSENTIAL HYPERTENSION, BENIGN (ICD-401.1) Keep on same regimn.  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  Other Orders: EKG w/ Interpretation (93000)  Patient Instructions: 1)  Your physician recommends that you schedule a follow-up appointment in: 1 YEAR  WITH DR. Jahdiel Krol 2)  Your physician recommends that you continue on your current medications as directed. Please refer to the Current Medication list given to you today. 3)  Your physician has requested that you have a carotid duplex. This test is an ultrasound of the carotid arteries in your neck. It looks at blood flow through these arteries that supply the brain with blood. Allow one hour for this exam. There are no restrictions or special instructions.  1 YEAR (2013) Prescriptions: POTASSIUM CHLORIDE CRYS CR 20 MEQ CR-TABS (POTASSIUM CHLORIDE CRYS CR) Take one tablet by mouth daily  #90 x 3   Entered by:   Lisabeth Devoid RN   Authorized by:   Sherrill Raring, MD, [facility]   Signed by:   Lisabeth Devoid RN on 05/09/2010   Method used:   Electronically to        [facility] [phone]* (retail)       [address]       [place], [place]  [address]       Ph: [phone]       Fax: [phone]   RxID:   [id number] FUROSEMIDE 20 MG TABS (FUROSEMIDE) Take one tablet by mouth daily.  #90 x 3   Entered by:   Lisabeth Devoid RN   Authorized by:   Sherrill Raring, MD, [facility]   Signed by:   Lisabeth Devoid RN on 05/09/2010   Method used:   Electronically to        [facility] [phone]* (retail)       [address]       [place], [place]  [address]       Ph: [phone]       Fax: [phone]   RxID:   [id number] LISINOPRIL 5 MG TABS (LISINOPRIL) Take one tablet by mouth daily  #90 x 3   Entered by:   Lisabeth Devoid RN   Authorized by:   Sherrill Raring, MD, [facility]   Signed by:   Lisabeth Devoid RN on 05/09/2010   Method used:   Electronically to        [facility] [phone]* (retail)       [address]       [place], [place]  [address]       Ph: [phone]       Fax: [phone]   RxID:   [id number] CARVEDILOL 6.25 MG TABS (CARVEDILOL) Take one tablet by mouth twice a day  #180 x 3   Entered by:   Lisabeth Devoid RN   Authorized by:   Sherrill Raring, MD, [facility]   Signed by:    Lisabeth Devoid RN on 05/09/2010   Method used:   Electronically to        [facility] [phone]* (retail)       [address]  [place], [place]  [address]       Ph: [phone]       Fax: [phone]   RxID:   [id number] SIMVASTATIN 80 MG TABS (SIMVASTATIN) once daily  #90 x 3   Entered by:   Lisabeth Devoid RN   Authorized by:   Sherrill Raring, MD, [facility]   Signed by:   Lisabeth Devoid RN on 05/09/2010   Method used:   Electronically to        [facility] [phone]* (retail)       [address]       [place], [place]  [address]       Ph: [phone]       Fax: [phone]   RxID:   [id number]

## 2010-07-18 LAB — COMPREHENSIVE METABOLIC PANEL
ALT: 9 U/L (ref 0–35)
CO2: 33 mEq/L — ABNORMAL HIGH (ref 19–32)
Calcium: 9.2 mg/dL (ref 8.4–10.5)
Creatinine, Ser: 0.6 mg/dL (ref 0.4–1.2)
GFR calc Af Amer: >60 mL/min (ref >60)
GFR calc non Af Amer: >60 mL/min (ref >60)
Glucose, Bld: 125 mg/dL — ABNORMAL HIGH (ref 70–99)
Sodium: 141 mEq/L (ref 135–145)
Total Protein: 6.2 g/dL (ref 6.0–8.3)

## 2010-07-18 LAB — CBC
MCV: 88.1 fL (ref 78.0–100.0)
Platelets: 158 10*3/uL (ref 150–400)
RDW: 13.3 % (ref 11.5–15.5)
WBC: 4.7 10*3/uL (ref 4.0–10.5)

## 2010-07-18 LAB — GLUCOSE, CAPILLARY
Glucose-Capillary: 112 mg/dL — ABNORMAL HIGH (ref 70–99)
Glucose-Capillary: 248 mg/dL — ABNORMAL HIGH (ref 70–99)

## 2010-07-18 LAB — HEMOGLOBIN A1C: Hgb A1c MFr Bld: 6.5 % — ABNORMAL HIGH (ref 4.6–6.1)

## 2010-07-29 LAB — ANAEROBIC CULTURE

## 2010-07-29 LAB — CULTURE, ROUTINE-ABSCESS

## 2010-07-31 ENCOUNTER — Telehealth: Payer: Self-pay | Admitting: Internal Medicine

## 2010-07-31 NOTE — Telephone Encounter (Signed)
Patient's daughter Agustin Cree is  calling to clarify a message. Pt. States  some one from this office called   yesterday to tell  patient to lower the dose of Simvastatin. I let daughter know I will call her back as soon as soon as I find out.

## 2010-07-31 NOTE — Telephone Encounter (Signed)
[name] has question re pt meds.

## 2010-07-31 NOTE — Telephone Encounter (Signed)
Dr. Tenny Craw order for pt. To decrease Simvastatin to 40 mg once a day. Darlene patient's daughter aware.

## 2010-08-01 LAB — COMPREHENSIVE METABOLIC PANEL
ALT: 32 U/L (ref 0–35)
AST: 47 U/L — ABNORMAL HIGH (ref 0–37)
Albumin: 2.5 g/dL — ABNORMAL LOW (ref 3.5–5.2)
Albumin: 2.6 g/dL — ABNORMAL LOW (ref 3.5–5.2)
Alkaline Phosphatase: 26 U/L — ABNORMAL LOW (ref 39–117)
BUN: 7 mg/dL (ref 6–23)
BUN: 7 mg/dL (ref 6–23)
Calcium: 8 mg/dL — ABNORMAL LOW (ref 8.4–10.5)
Calcium: 8.2 mg/dL — ABNORMAL LOW (ref 8.4–10.5)
Chloride: 101 mEq/L (ref 96–112)
Creatinine, Ser: 0.56 mg/dL (ref 0.4–1.2)
Creatinine, Ser: 0.6 mg/dL (ref 0.4–1.2)
GFR calc Af Amer: >60 mL/min (ref >60)
Potassium: 2.6 mEq/L — CL (ref 3.5–5.1)
Sodium: 136 mEq/L (ref 135–145)
Total Bilirubin: 3 mg/dL — ABNORMAL HIGH (ref 0.3–1.2)
Total Protein: 5.1 g/dL — ABNORMAL LOW (ref 6.0–8.3)
Total Protein: 5.4 g/dL — ABNORMAL LOW (ref 6.0–8.3)
Total Protein: 5.5 g/dL — ABNORMAL LOW (ref 6.0–8.3)

## 2010-08-01 LAB — BASIC METABOLIC PANEL
BUN: 3 mg/dL — ABNORMAL LOW (ref 6–23)
CO2: 28 mEq/L (ref 19–32)
Chloride: 103 mEq/L (ref 96–112)
Creatinine, Ser: 0.49 mg/dL (ref 0.4–1.2)
GFR calc Af Amer: >60 mL/min (ref >60)
GFR calc non Af Amer: >60 mL/min (ref >60)
GFR calc non Af Amer: >60 mL/min (ref >60)
Glucose, Bld: 131 mg/dL — ABNORMAL HIGH (ref 70–99)
Potassium: 3 mEq/L — ABNORMAL LOW (ref 3.5–5.1)
Sodium: 137 mEq/L (ref 135–145)

## 2010-08-01 LAB — GLUCOSE, CAPILLARY
Glucose-Capillary: 106 mg/dL — ABNORMAL HIGH (ref 70–99)
Glucose-Capillary: 121 mg/dL — ABNORMAL HIGH (ref 70–99)
Glucose-Capillary: 123 mg/dL — ABNORMAL HIGH (ref 70–99)
Glucose-Capillary: 134 mg/dL — ABNORMAL HIGH (ref 70–99)
Glucose-Capillary: 144 mg/dL — ABNORMAL HIGH (ref 70–99)
Glucose-Capillary: 145 mg/dL — ABNORMAL HIGH (ref 70–99)
Glucose-Capillary: 152 mg/dL — ABNORMAL HIGH (ref 70–99)
Glucose-Capillary: 200 mg/dL — ABNORMAL HIGH (ref 70–99)

## 2010-08-01 LAB — CBC
HCT: 31.7 % — ABNORMAL LOW (ref 36.0–46.0)
HCT: 35 % — ABNORMAL LOW (ref 36.0–46.0)
Hemoglobin: 11.1 g/dL — ABNORMAL LOW (ref 12.0–15.0)
MCHC: 34.8 g/dL (ref 30.0–36.0)
MCV: 92.8 fL (ref 78.0–100.0)
Platelets: 117 10*3/uL — ABNORMAL LOW (ref 150–400)
RBC: 3.47 MIL/uL — ABNORMAL LOW (ref 3.87–5.11)
RDW: 13.1 % (ref 11.5–15.5)
RDW: 13.2 % (ref 11.5–15.5)
RDW: 14 % (ref 11.5–15.5)
WBC: 5.3 10*3/uL (ref 4.0–10.5)

## 2010-08-01 LAB — LIPASE, BLOOD
Lipase: 28 U/L (ref 11–59)
Lipase: 47 U/L (ref 11–59)

## 2010-08-01 LAB — POCT I-STAT 4, (NA,K, GLUC, HGB,HCT)
Hemoglobin: 10.2 g/dL — ABNORMAL LOW (ref 12.0–15.0)
Sodium: 137 mEq/L (ref 135–145)

## 2010-08-01 LAB — BILIRUBIN, FRACTIONATED(TOT/DIR/INDIR): Bilirubin, Direct: 0.4 mg/dL — ABNORMAL HIGH (ref 0.0–0.3)

## 2010-08-01 LAB — AMYLASE: Amylase: 64 U/L (ref 27–131)

## 2010-08-01 LAB — POTASSIUM: Potassium: 2.6 mEq/L — CL (ref 3.5–5.1)

## 2010-08-02 LAB — COMPREHENSIVE METABOLIC PANEL
ALT: 19 U/L (ref 0–35)
AST: 21 U/L (ref 0–37)
AST: 39 U/L — ABNORMAL HIGH (ref 0–37)
Albumin: 3.8 g/dL (ref 3.5–5.2)
Alkaline Phosphatase: 30 U/L — ABNORMAL LOW (ref 39–117)
CO2: 28 mEq/L (ref 19–32)
Calcium: 8.3 mg/dL — ABNORMAL LOW (ref 8.4–10.5)
Calcium: 9 mg/dL (ref 8.4–10.5)
Chloride: 103 mEq/L (ref 96–112)
Creatinine, Ser: 0.81 mg/dL (ref 0.4–1.2)
GFR calc Af Amer: >60 mL/min (ref >60)
GFR calc non Af Amer: >60 mL/min (ref >60)
Glucose, Bld: 216 mg/dL — ABNORMAL HIGH (ref 70–99)
Sodium: 137 mEq/L (ref 135–145)
Total Bilirubin: 2.8 mg/dL — ABNORMAL HIGH (ref 0.3–1.2)

## 2010-08-02 LAB — CBC
Hemoglobin: 13.4 g/dL (ref 12.0–15.0)
Hemoglobin: 13.6 g/dL (ref 12.0–15.0)
MCHC: 34.2 g/dL (ref 30.0–36.0)
MCHC: 34.3 g/dL (ref 30.0–36.0)
MCHC: 34.7 g/dL (ref 30.0–36.0)
Platelets: 170 10*3/uL (ref 150–400)
RBC: 4.23 MIL/uL (ref 3.87–5.11)
RDW: 13.5 % (ref 11.5–15.5)
RDW: 13.7 % (ref 11.5–15.5)
WBC: 11 10*3/uL — ABNORMAL HIGH (ref 4.0–10.5)

## 2010-08-02 LAB — POCT CARDIAC MARKERS
CKMB, poc: <1 ng/mL — ABNORMAL LOW (ref 1.0–8.0)
Myoglobin, poc: 74.1 ng/mL (ref 12–200)
Troponin i, poc: <0.05 ng/mL (ref 0.00–0.09)

## 2010-08-02 LAB — GLUCOSE, CAPILLARY
Glucose-Capillary: 185 mg/dL — ABNORMAL HIGH (ref 70–99)
Glucose-Capillary: 186 mg/dL — ABNORMAL HIGH (ref 70–99)
Glucose-Capillary: 236 mg/dL — ABNORMAL HIGH (ref 70–99)
Glucose-Capillary: 238 mg/dL — ABNORMAL HIGH (ref 70–99)
Glucose-Capillary: 265 mg/dL — ABNORMAL HIGH (ref 70–99)

## 2010-08-02 LAB — LIPASE, BLOOD
Lipase: 295 U/L — ABNORMAL HIGH (ref 11–59)
Lipase: 709 U/L — ABNORMAL HIGH (ref 11–59)
Lipase: 7448 U/L — ABNORMAL HIGH (ref 11–59)

## 2010-08-02 LAB — URINALYSIS, ROUTINE W REFLEX MICROSCOPIC
Bilirubin Urine: NEGATIVE
Glucose, UA: NEGATIVE mg/dL
Hgb urine dipstick: NEGATIVE
Ketones, ur: NEGATIVE mg/dL
Protein, ur: NEGATIVE mg/dL

## 2010-08-02 LAB — HEPATIC FUNCTION PANEL
Albumin: 3.8 g/dL (ref 3.5–5.2)
Bilirubin, Direct: 0.7 mg/dL — ABNORMAL HIGH (ref 0.0–0.3)
Indirect Bilirubin: 2.1 mg/dL — ABNORMAL HIGH (ref 0.3–0.9)
Total Bilirubin: 2.8 mg/dL — ABNORMAL HIGH (ref 0.3–1.2)

## 2010-08-02 LAB — HEMOGLOBIN A1C: Mean Plasma Glucose: 146 mg/dL

## 2010-08-02 LAB — POCT I-STAT, CHEM 8
BUN: 14 mg/dL (ref 6–23)
HCT: 40 % (ref 36.0–46.0)
Hemoglobin: 13.6 g/dL (ref 12.0–15.0)
Sodium: 139 mEq/L (ref 135–145)
TCO2: 26 mmol/L (ref 0–100)

## 2010-08-02 LAB — DIFFERENTIAL
Basophils Absolute: 0 10*3/uL (ref 0.0–0.1)
Eosinophils Absolute: 0.1 10*3/uL (ref 0.0–0.7)
Lymphocytes Relative: 13 % (ref 12–46)
Neutro Abs: 9.8 10*3/uL — ABNORMAL HIGH (ref 1.7–7.7)

## 2010-08-02 LAB — BRAIN NATRIURETIC PEPTIDE
Pro B Natriuretic peptide (BNP): 210 pg/mL — ABNORMAL HIGH (ref 0.0–100.0)
Pro B Natriuretic peptide (BNP): 406 pg/mL — ABNORMAL HIGH (ref 0.0–100.0)

## 2010-08-02 LAB — TSH: TSH: 0.116 u[IU]/mL — ABNORMAL LOW (ref 0.350–4.500)

## 2010-08-05 LAB — POCT I-STAT GLUCOSE
Glucose, Bld: 162 mg/dL — ABNORMAL HIGH (ref 70–99)
Operator id: 133881

## 2010-09-09 NOTE — H&P (Signed)
NAME:  [name], [name] NO.:  [id number]   MEDICAL RECORD NO.:  [id number]          PATIENT TYPE:  INP   LOCATION:  2301                         FACILITY:  [facility]   PHYSICIAN:  Tia Alert, MD     DATE OF BIRTH:  07/03/1933   DATE OF ADMISSION:  10/25/2007  DATE OF DISCHARGE:                              HISTORY & PHYSICAL   ADMISSION DIAGNOSIS:  Left extra-axial fluid collection status post  craniotomy for arachnoid cyst.   BRIEF HISTORY OF PRESENT ILLNESS:  [name] is a 77 year old white  female who is well-known to me who is status post craniotomy for a left-  sided arachnoid cyst.  About 1 month ago, she was in the rehab unit here  at the hospital doing quite well, but on serial CT scans had an  enlarging left extra-axial fluid collection.  The fluid is hypodense  consistent with either hygroma or subdural hematoma.  I recommended burr  hole and placement of subdural drain for evacuation of the lesion.  The  patient and the family understood the risks, benefits, expected outcome  and wished to proceed and therefore she is readmitted to the hospital  for that surgery.   PHYSICAL EXAMINATION:  GENERAL:  She is awake and alert.  She is  pleasant, interactive.  She has short-term memory difficulty.  She has  good attention span.  No facial asymmetry.  No pronator drift.  Gait is  not tested.  Sensation is intact.  CT scan shows a 15 mm left extra-  axial fluid collection which is hypodense with some shift in the midline  structures.  There is no hydrocephalus.  There is a chronic defect with  the arachnoid cyst, was located though it is smaller than its  preoperative status.   ASSESSMENT/PLAN:  A 77 year old female with a left chronic extra-axial  fluid collection consistent with a hygroma or subdural hematoma.  I  recommended bur hole for evacuation of the lesion.  The patient and the  family understood the risks, benefits, expected outcome and wished to  proceed.      Tia Alert, MD  Electronically Signed     Tia Alert, MD  Electronically Signed    DSJ/MEDQ  D:  10/26/2007  T:  10/26/2007  Job:  [id number]

## 2010-09-09 NOTE — Op Note (Signed)
NAME:  ARIYANAH, AGUADO NO.:  [id number]   MEDICAL RECORD NO.:  [id number]          PATIENT TYPE:  IPS   LOCATION:  4028                         FACILITY:  [facility]   PHYSICIAN:  Tia Alert, MD     DATE OF BIRTH:  03-Jul-1933   DATE OF PROCEDURE:  DATE OF DISCHARGE:  10/25/2007                               OPERATIVE REPORT   PREOPERATIVE DIAGNOSIS:  Left extra-axial fluid collection status post  craniotomy for arachnoid cyst.   POSTOPERATIVE DIAGNOSIS:  Left extra-axial fluid collection status post  craniotomy for arachnoid cyst.  Fluid consistent with subdural hygroma.   PROCEDURE:  Left of bur hole for subdural hygroma followed by placement  of subdural drain.   SURGEON:  Tia Alert, MD   ANESTHESIA:  General endotracheal.   COMPLICATIONS:  None apparent.   INDICATIONS FOR PROCEDURE:  Ms. Tibbetts is a 77 year old female who  underwent a left craniotomy for a large frontotemporal arachnoid cyst  about a month ago.  She had serial CT scan showing a growing extra-axial  fluid collection on the left in the parietal region, recommended a bur  hole for drainage of the lesion with placement of the subdural drain.  Family understood the risk, benefit, suspected outcome, and wished to  proceed.   DESCRIPTION OF PROCEDURE:  The patient was taken to the operating room  and after induction of adequate generalized endotracheal anesthesia, her  head was turned to the left.  Her old incision was prepped out.  We also  prepped out in the parietal region adjacent to the posterosuperior  temporal line in preparation for having to perform a second bur hole to  irrigate if this turned out to be a subdural hematoma.  Her head was  prepped with DuraPrep and then draped in the usual sterile fashion.  5  mL of local anesthesia was injected and a small portion of her old  incision was opened up and a small bur hole was created with the hand  drill adjacent to the previous  craniotomy line in the left parietal  region.  The dura was identified, was opened in a cruciate fashion and  then coagulated and the underlying fluid was hygromatous.  It was under  no pressure, I irrigated with saline solution and the irrigant was  clear.  I placed a subdural drain through this, lined the hole with  Gelfoam, ran the drain through a separate stab incision and attached it  to its distal reservoir.  The incision was then closed with 2-0 Vicryl  in the galea followed by staples in the skin.  A sterile dressing was  applied.  The patient was then awakened from general anesthesia and  transferred to recovery room in stable condition where she was awake and  alert moving all extremities and speaking and complaining of no  headache.  At the end of procedure, all sponge, needle and sponge counts  were correct .      Tia Alert, MD  Electronically Signed    DSJ/MEDQ  D:  10/25/2007  T:  10/26/2007  Job:  [id number]

## 2010-09-09 NOTE — H&P (Signed)
NAMEBRENLY, [name] NO.:  [id number]   MEDICAL RECORD NO.:  [id number]          PATIENT TYPE:  IPS   LOCATION:  4028                         FACILITY:  [facility]   PHYSICIAN:  Ranelle Oyster, M.D.DATE OF BIRTH:  [birth year]   DATE OF ADMISSION:  10/05/2007  DATE OF DISCHARGE:                              HISTORY & PHYSICAL   CHIEF COMPLAINT:  Decreased memory and gait.   HISTORY OF PRESENT ILLNESS:  This is a 77 year old white female with  progressive memory problems and gait difficulties.  She was admitted on  September 28, 2007, for a large arachnoid cyst that was found on imaging with  positive mass effect.  She underwent a left craniotomy and a  fenestration of the cyst on September 28, 2007, by Dr. Yetta Barre.  She was placed  on Dilantin for seizure prophylaxis.  Followup CT on October 02, 2007, was  stable with decreased midline shift.  The patient has had persistent low-  grade temperature and increasing lethargy over the last 2 days.  Chest x-  ray was performed today.  I followed this up and this was clear.  Her  urine analysis was collected this morning and I recently checked this  and it appears positive for a UTI.  The patient continues to struggle  with self-care and mobility and thus was admitted to Inpatient Rehab  Unit today after a consultation by the physiatry team.   REVIEW OF SYSTEMS:  Notable for the above.  The patient's family denies  cough.  She has had some urinary frequency.  Full reviews in the written  H&P.   PAST MEDICAL HISTORY:  Positive for hypothyroidism, hyperlipidemia,  noninsulin-requiring diabetes, and hysterectomy.  The patient denies  alcohol or tobacco use.   FAMILY HISTORY:  Positive for CAD.   SOCIAL HISTORY:  The patient lives alone.  The patient has 2 local  daughters and 4 sons who can help her and provide 24-hour assisted home  as needed.  They have 1-level house and no steps to enter.   FUNCTIONAL HISTORY:  The patient was  completely independent prior to  arrival.  Currently, the patient is requiring min to mod assist  depending on her lethargy for self-care and mobility.   ALLERGIES:  None.   HOME MEDICATIONS:  1. Fish oil daily.  2. Inderal LA 80 mg daily.  3. Zocor 80 mg daily.  4. Synthroid 100 mcg daily.  5. Questionable oral diabetic medications.  6. B12 daily.   LABORATORY DATA:  Hemoglobin 15.6, white count 14.8, platelets 390,  sodium 131, potassium 4.1, BUN and creatinine 10 and 0.8.  Dilantin  level is 11.2 as of October 03, 2007.  No albumin was checked.   PHYSICAL EXAMINATION:  VITALS:  Blood pressure 126/82, pulse 88,  respiratory 18, and temperature 99.  GENERAL:  The patient is pleasant, very fatigued, and sluggish on exam  today.  HEENT:  Pupils are equal, round, and reactive to light.  EAR, NOSE, AND THROAT:  Notable for intact dentition and mucosa pink.  NECK:  Supple without JVD or  lymphadenopathy.  SKIN:  Notable for scar over the left frontotemporal area with no  discharge or drainage.  She has some mild edema around the frontal area.  CHEST:  Clear to auscultation bilaterally and the effort was poor.  HEART:  Regular rate and rhythm without murmur, rubs, or gallops.  ABDOMEN:  Soft and nontender.  Bowel sounds were  positive.  MUSCULOSKELETAL:  Gait was not examined today.  Strength was generally  4/5 on the left, 3+-4/5 on the right with decreased effort today due to  lethargy.  Fine motor coordination was decreased bilaterally, right more  than left.  NEUROLOGIC:  The patient seemed to have problems with word finding and  gathering as well as organization and concentration today.  Affect was  very flat.  Sensory exam revealed intact pain sense in both upper and  lower extremities today.   ASSESSMENT AND PLAN:  1. Functional deficit secondary to left frontoparietal arachnoid cyst      with mass effect, status post craniotomy and fenestration      postoperative day #7.  The  patient was admitted to inpatient rehab      for comprehensive and collaborative medical and physical care to      address the patient's multiple issues.  The patient's comprehensive      care cannot be provided unless her intensity of service, thus she      was admitted here today.  She will be receiving on average at least      3 hours a day of therapy, 5 days a week to include PT, which will      assess and treat for range of motion, safety, balance, and gait      training.  OT will assess and treat for range of motion,      appropriate ADLs and safety at home as well as equipment.  Rehab      nurse will assess and treat the patient for skin, bowel, bladder,      and wound issues.  Speech language pathology will follow for brief      cognitive screen and treat as appropriate.  Rehab case      manager/social worker will assess for psychosocial needs and      discharge planning.  Rehab physician will follow and manage the      above physical needs in the context of medical issues listed below      on 24-hour day basis.  Estimated length of stay is 7-10 days.      Goals, supervision to modified independent.  Prognosis good.  2. Diabetes:  Continue Glucophage and glyburide and monitor closely      particularly in the context of the patient's lethargy and      likelihood of decreased intake.  3. Seizure prophylaxis, dilantin.  Need to check level and albumin      tomorrow.  4. Lethargy:  This is likely due to urinary tract infection.  We will      treat urinary tract infection empirically with amoxicillin and      await urine culture.  Chest x-ray is clear.  Lethargy also could be      related to Dilantin toxicity.  5. Hypothyroidism:  Continue Synthroid.  Check thyroid levels      tomorrow.  6. Blood pressure control.  Continue Inderal LA 80 mg daily and watch      for signs and symptoms of fluctuation there as well.  7. Deep venous thrombosis  prophylaxis with ambulation.       Ranelle Oyster, M.D.  Electronically Signed     Ranelle Oyster, M.D.  Electronically Signed    ZTS/MEDQ  D:  10/05/2007  T:  10/06/2007  Job:  [id number]

## 2010-09-09 NOTE — Op Note (Signed)
NAME:  [name], [name] NO.:  [id number]   MEDICAL RECORD NO.:  [id number]          PATIENT TYPE:  INP   LOCATION:  3110                         FACILITY:  [facility]   PHYSICIAN:  Tia Alert, MD     DATE OF BIRTH:  01-05-1934   DATE OF PROCEDURE:  09/28/2007  DATE OF DISCHARGE:                               OPERATIVE REPORT   PREOPERATIVE DIAGNOSIS:  Left intracranial arachnoid cyst.   POSTOPERATIVE DIAGNOSIS:  Left intracranial arachnoid cyst.   PROCEDURE:  1. Left pterional craniotomy for fenestration of arachnoid cyst.  2. Microsurgical dissection.   SURGEON:  Tia Alert, MD   ASSISTANT:  Donalee Citrin, MD   ANESTHESIA:  General endotracheal.   COMPLICATIONS:  None apparent.   INDICATIONS FOR PROCEDURE:  Ms. [name] is a 77 year old female, who was  referred with memory disturbance, so she had an MRI, which showed a very  large arachnoid cyst in the frontotemporal region on the left side with  significant mass effect and shift to the midline structures.  I  recommended a craniotomy for fenestration of the cyst.  The family and  the patient understood the risks, benefits, expected outcome, and wished  to proceed.   DESCRIPTION OF PROCEDURE:  The patient was taken to the operating room  and after induction of adequate generalized endotracheal anesthesia, her  head was affixed in the three-point Mayfield headrest and turned to the  right to expose the left pterional region.  A small strip of hair was  shaved and then prepped with DuraPrep and then draped in usual sterile  fashion.  A left pterional incision was made, and the skin and  temporalis flap was reflected forward exposing the left pterional  region.  Three bur holes were placed, one in the keyhole, one in the  superior temporal line posteriorly, and one in the inferior temporal  region, and then craniotomy flap was turned with a high-speed drill.  The underlying dura was then opened in a  curvilinear fashion.  She had  significant epidural bleeding from the superior edge because of the  laxity of the dura; therefore, we lined this with Gelfoam and placed  several dural tack-up sutures.  We then brought in the operating  microscope.  She had significant abnormalities of the brain surface from  the chronicity of this arachnoid cyst.  We could visualize the ependyma  of the left lateral ventricle.  The sylvian fissure was pushed away from  [place] and most of the distal ICA and the MCA was visible without dissecting  through the fissure.  We used microscopic dissection with Rhoton  dissectors and an arachnoid blade and opened the carotico-optic cistern  and dissected until we identified the left optic nerve, the right optic  nerve, the optic chiasm, and the ICA running under the optic nerve.  We  then went back to where the sylvian fissure was and dissected along the  MCA to the ICA.  We identified the ACA.  We identified the anterior  choroidal artery as well as the PComm.  We opened the  membrane of  Lilliquist.  We identified the third nerve, but made sure we had a nice  communication between the arachnoid cyst and the basal cisterns.  Once  these areas were fenestrated, we had good flow of fluid between the two  areas.  We dried all bleeding points superiorly, inspected the brain,  inspected our fenestration, and we felt like we had a good fenestration.  Therefore, we filled this with saline.  We then closed the dura with  interrupted 4-0 Nurolon sutures and lined the dura with Durafoam and  Gelfoam.  We then replaced the cranial flap with 3 cranial plates,  placed a subgaleal drain, and closed the temporalis fascia with 2-0  Vicryl, closed the galea with 2-0 Vicryl, and then closed the skin with  staples.  The drapes were removed.  Sterile dressing was applied.  The  patient was taken out of three-point Mayfield headrest and transferred  to recovery room in stable condition.   At the end of procedure, all  sponge, needle, and instrument counts were correct.      Tia Alert, MD  Electronically Signed     DSJ/MEDQ  D:  09/28/2007  T:  09/29/2007  Job:  [id number]

## 2010-09-09 NOTE — Consult Note (Signed)
[name], [name] NO.:  [id number]   MEDICAL RECORD NO.:  [id number]          PATIENT TYPE:  INP   LOCATION:  4738                         FACILITY:  [facility]   PHYSICIAN:  Arturo Morton. Riley Kill, MD, FACCDATE OF BIRTH:  1933/09/07   DATE OF CONSULTATION:  12/26/2008  DATE OF DISCHARGE:                                 CONSULTATION   PRIMARY CARDIOLOGIST:  Dr. Dietrich Pates.   PRIMARY CARE PHYSICIAN:  Dr. Geoffry Paradise.   REASON FOR CONSULTATION:  Preop evaluation in the setting of nonischemic  cardiomyopathy for cholecystectomy secondary to gallstone pancreatitis.   HISTORY OF PRESENT ILLNESS:  A 77 year old Caucasian female with known  history of nonischemic cardiomyopathy with an EF of 35% per cardiac  catheterization Sep 03, 2008, revealing nonobstructive CAD, also along  with diabetes, hypertension, who presented to the emergency room on  December 24, 2008, with severe abdominal pain, sudden onset with nausea,  vomiting and diarrhea after eating lunch that day.  The patient was seen  in the emergency room secondary to this continued severe abdominal pain  and studies revealed pancreatitis and cholelithiasis.  Lipase on  admission was 7448.  Point of care markers were negative.  As a result  of the testing, the patient was found to be a candidate for  cholecystectomy and we were asked to evaluate the patient preoperatively  for cardiac risk to undergo surgery.  The patient has been without  complaints of shortness of breath, chest pain, weakness or fatigue.  Her  only complaints have been related to recent gallbladder issues.   REVIEW OF SYSTEMS:  Positive for nausea, vomiting, diarrhea, GERD and  abdominal pain.  All other systems have been reviewed and found to be  negative other than those listed above.   PAST MEDICAL HISTORY:  1. Nonischemic cardiomyopathy with an EF of 35% per cardiac      catheterization in May 2010, 25% per echocardiogram in April 2010.  2. Nonobstructive CAD.      a.     Status post cardiac catheterization dated Aug 28, 2008.          I. LVEF 35% with moderate global hypokinesis, appears worse              toward the apex with the true apex appearing severely              hypokinetic to akinetic.  This does not appear dyskinetic.              There is 2-3+ mitral regurgitation.  The coronary system is              right dominant, RCA has 30% proximal stenosis, otherwise              luminal irregularities throughout its extent.  Left main              free from significant disease.  LAD and the left circumflex              are small caliber vessels.  The LAD has 40% just  after the              first diagonal, otherwise luminal irregularities.  Left              circumflex has mild luminal irregularities.  3. Diabetes.  4. Hypertension.  5. History of CHF.  6. Hypothyroidism.  7. Hyperlipidemia.  8. Anxiety.  9. Subarachnoid cyst, status post craniotomy.   PAST SURGICAL HISTORY:  1. Subarachnoid cyst removal.  2. Hysterectomy.   SOCIAL HISTORY:  The patient lives in [place] alone.  She is retired.  She has a son and daughter.  She does not smoke, does not drink alcohol  or use illicit drugs.   FAMILY HISTORY:  Positive for CAD and gangrenous gallbladder in her  mother.   MEDICATIONS PRIOR TO ADMISSION:  1. Potassium 20 mEq daily.  2. Glucovance 5/500 b.i.d.  3. Xanax 0.25 mg at h.s.  4. Lisinopril 5 mg daily.  5. Lasix 20 mg daily.  6. Carvedilol 6.25 mg b.i.d.  7. Fish oil daily.  8. Synthroid 100 mcg daily.  9. Zocor 80 mg daily.   HOSPITAL MEDICATIONS:  1. Vasotec 1.25 mg IV b.i.d.  2. Levothyroxine 50 mcg IV daily.  3. Heparin 5000 units sub cu b.i.d.  4. Piperacillin 3.75 mg p.o. IV q.8 Ch.   ALLERGIES:  NO KNOWN DRUG ALLERGIES.   CURRENT LABORATORY DATA:  Sodium 135, potassium 3.6, chloride 101, CO2  of 27, BUN 7, creatinine 0.56, glucose 157, hemoglobin 12.1, hematocrit  35.0, white blood  cells 8.6, platelets 117.  TSH 0.116, hemoglobin A1c  6.7.  BNP 406, amylase currently 173, lipase currently 47, total bili  2.8, alkaline phosphatase 30, AST 21, ALT 19, total protein 5.6, albumin  2.9, calcium 8.3.  EKG revealing heart rate of 61 beats per minute,  sinus rhythm with left bundle branch block dated on admission of December 24, 2008.   RADIOLOGY:  Abdominal ultrasound - gallstones without evidence of bowel  obstructions.  Chest x-ray - no acute disease.   PHYSICAL EXAMINATION:  VITAL SIGNS:  Blood pressure 125/64, pulse 100,  respirations 20, temperature 98.1.  O2 saturation 92% on room air.  GENERAL:  She is awake, alert and oriented.  No acute distress at  present.  HEENT:  Head is normocephalic and atraumatic.  Eyes PERRLA.  Mucous  membranes in mouth are pink and moist.  Tongue is midline.  NECK:  Supple without JVD or carotid bruits appreciated.  CARDIOVASCULAR:  Tachycardiac without murmurs, rubs or gallops present.  Pulses are 2+ and equal without bruits.  LUNGS:  Have some mild rales bibasilar.  No wheezes or rhonchi.  SKIN:  Warm and dry.  ABDOMEN:  Soft with tenderness noted bilaterally with 2+ bowel sounds.  EXTREMITIES:  Without clubbing, cyanosis or edema.  NEURO:  Cranial nerves II-XII are grossly intact.   IMPRESSION:  1. Acute gallstone pancreatitis.  2. Nonischemic cardiomyopathy with an ejection fraction of 25-35%.  3. Hypertension.  4. Diabetes.   PLAN:  A 77 year old Caucasian female with gallstone pancreatitis in  need of cholecystectomy and cardiac preoperative clearance.  The patient  will need to have IV Lopressor added for heart rate control.  Fluid  status should be strictly monitored perioperatively to avoid fluid  overload.  We will start IV Lasix postoperatively and move toward p.o.  postoperatively as soon as possible.  The patient is a low to moderate  risk for perioperative cardiac complications, nonobstructive CAD per  cardiac  catheterization in May 2010, and she has been asymptomatic.  We  will follow perioperatively making further recommendations.  Dr.  Janese Banks has been discussed per phone, who agrees with assessment and  plan.  He will follow making further recommendations.   On behalf of the physician providers of [facility],  we would like to thank Dr. Jacky Kindle for allowing [place] to participate in the  care of this patient.      Bettey Mare. Lyman Bishop, NP      Arturo Morton. Riley Kill, MD, [facility]  Electronically Signed    KML/MEDQ  D:  12/26/2008  T:  12/26/2008  Job:  [id number]   cc:   Geoffry Paradise, M.D.

## 2010-09-09 NOTE — Consult Note (Signed)
[name], Arroyo NO.:  [id number]   MEDICAL RECORD NO.:  [id number]          PATIENT TYPE:  INP   LOCATION:  4738                         FACILITY:  [facility]   PHYSICIAN:  John C. Madilyn Fireman, M.D.    DATE OF BIRTH:  [birth year]   DATE OF CONSULTATION:  12/26/2008  DATE OF DISCHARGE:                                 CONSULTATION   REASON FOR CONSULTATION:  Suspected common bile duct stone with  gallstone pancreatitis.   HISTORY OF PRESENT ILLNESS:  The patient is an 76 year old white female  who presented with sudden onset of epigastric abdominal pain with  markedly elevated lipase and slightly elevated bilirubin.  Gallbladder  ultrasound showed multiple gallstones with suggested wall thickening,  some edema in the periportal area of liver next to the gallbladder and a  tapered dilatation of the common bile duct with maximal dilatation of 15  mm and no definite stones seen.  Her bilirubin has remained basically  stable over 3 days, but rose slightly from 2.83.  Her alkaline  phosphatase and ALT have been normal.  She had once a temperature of  101.  Her initial lipase was 7000, which is fallen to 48.  Her abdominal  pain has improved fairly significantly.   PAST MEDICAL HISTORY:  1. Nonischemic cardiomyopathy with an ejection fraction of 25-35%.  2. Hypertension.  3. Diabetes.   MEDICATIONS:  1. Vasotec.  2. Synthroid.  3. Lasix.  4. Carvedilol.  5. Zocor.  6. Potassium.  7. Glucovance.   FAMILY HISTORY:  Mother did have a gangrenous gallbladder.   SOCIAL HISTORY:  The patient denies alcohol or tobacco use.   PHYSICAL EXAMINATION:  Well-developed, well-nourished white female in no  acute distress.  HEART:  Regular rate and rhythm without murmurs.  LUNGS:  Clear.  ABDOMEN:  Soft, nondistended with normoactive bowel sounds.  No  hepatosplenomegaly or mass.  There is tenderness in the epigastrium.   IMPRESSION:  Gallstone pancreatitis with modest to  moderate evidence of  retained common bile duct stones.   PLAN:  We will confer with Dr. Andrey Campanile whether to perform in a  preoperative ERCP versus intraoperative cholangiogram at the time of  cholecystectomy and reserve ERCP for positive findings.           ______________________________  Everardo All Madilyn Fireman, M.D.     JCH/MEDQ  D:  12/26/2008  T:  12/26/2008  Job:  [id number]   cc:   Geoffry Paradise, M.D.  Andrey Campanile, Dr.

## 2010-09-09 NOTE — Procedures (Signed)
EEG NUMBER:  [id number]   CLINICAL HISTORY:  A 77 year old status post craniotomy for an arachnoid  cyst on the left frontal region.  The patient fell soon after surgery  with possible seizure activity.  Study is being done to look for  presence of seizures (780.39).   PROCEDURE:  The tracing is carried out on 32-channel, digital Cadwell  recorder reformatted into 16 channel montages with one devoted to EKG.  The patient was awake and asleep during the recording.  The  [employer] 10/20 system lead placement was used.   MEDICATIONS:  Include NovoLog, Synthroid, Glucophage, Protonix, Inderal,  Diabeta, Keppra, potassium chloride, Lantus, Desyrel, Vicodin, Tylenol,  and Phenergan.   DESCRIPTION OF FINDINGS:  Dominant frequency is an 8 Hz, alpha range  activity seen at the end of the record.  Mixed frequency theta and delta  range activity was generalized during the initial portion of the record.  There was no focal slowing.  There was no interictal epileptiform  activity in the form of spikes or sharp waves.  There was no breech  artifact in the left frontal temporal region.  EKG showed regular sinus  rhythm with ventricular response of 78 beats per minute.   IMPRESSION:  This is an abnormal EEG on the basis of mild diffuse  background slowing with the patient awake and drowsy.  This is a  nonspecific indicator of neuronal dysfunction may be a primary  degenerative basis or secondary to variety of toxic or metabolic  etiologies.  No seizure activity was seen.      Deanna Artis. Sharene Skeans, M.D.  Electronically Signed     ZOX:WRUE  D:  10/10/2007 17:29:55  T:  10/11/2007 01:01:40  Job #:  [id number]   cc:   Ranelle Oyster, M.D.  Fax: [phone]

## 2010-09-09 NOTE — H&P (Signed)
NAME:  [name], [name] NO.:  [id number]   MEDICAL RECORD NO.:  [id number]          PATIENT TYPE:  INP   LOCATION:  4738                         FACILITY:  [facility]   PHYSICIAN:  Gwen Pounds, MD       DATE OF BIRTH:  [birth year]   DATE OF ADMISSION:  12/23/2008  DATE OF DISCHARGE:                              HISTORY & PHYSICAL   PRIMARY CARE Keison Glendinning:  Geoffry Paradise, MD   CARDIOLOGIST:  Marca Ancona, MD   NEUROSURGEON:  Tia Alert, MD   GI DOCTOR:  The patient does not remember.   CHIEF COMPLAINT:  Nausea, vomiting, increased bilirubin, bradycardia  diagnosed with gallstone pancreatitis.   HISTORY OF PRESENT ILLNESS:  This is a 77 year old female with medical  problems who presents today after food-induced epigastric pain.  She  reports lately she has had a lot of nausea, indigestion, and she has  been taking lot of Tums.  She had several episodes of nausea and  vomiting today and every episode that has been witnesses has brought her  heart rate down to the 29s and 30s with a junctional rhythm.  She has  been diaphoretic.  No loss of consciousness.  She has gotten sick at  least 2-3 times while here and again each one triggers a vasovagal  bradycardia episode.  In the ED, she was given IV fluids.  Zofran and  sulfa were placed on the patient.  Workup initially showed an elevated  white count of 12.5, mildly sick lady, calcified gallstones on KUB.  AST  57, total bilirubin 2.8, blood sugar 226, everything else looked okay.  Lipase eventually came back at 74,00 and ultrasound came back with  multiple stones, dilated ducts, and gallbladder wall thickening.  The  patient ate a hot dog for lunch and has been taking antacids like crazy.  I was called and will admit her patient for further evaluation and  treatment.   PAST MEDICAL HISTORY:  1. Nonobstructive coronary artery disease.  2. Cardiomyopathy of questionable etiology.  3. EF 35% with moderate global  hypokinesis.  4. Mitral regurgitation.  5. Left frontoparietal subarachnoid cyst, status post craniotomy in      June 2009.  6. Burr hole drainage of hygroma.  7. Diabetes mellitus type 2.  8. Hypertension.  9. Hypothyroid.  10.Hyperlipidemia.  11.Anxiety.   ALLERGIES:  No known drug allergies.   MEDICATION LIST:  1. KCl 20 mg p.o. daily.  2. Glucovance 5/500 b.i.d.  3. Xanax 0.25 at bedtime.  4. Lisinopril 5 daily.  5. Carvedilol 6.25 b.i.d.  6. Lasix 20 daily.  7. Fish oil daily.  8. Cinnamon daily.  9. Synthroid 100 daily.  10.Zocor 80 at bedtime.  11.Acid blockers.  12.Calcium plus D.   SOCIAL HISTORY:  She lives alone.  Has a daughter and one son.  Family  live close.  No alcohol.  No tobacco.   FAMILY HISTORY:  Mother had gangrenous gallstone pancreatitis and had a  gallbladder removed.  Subsequently lived many years and died at age of  59 years.  REVIEW OF SYSTEMS:  Denies any chest pain or shortness of breath.  Has  been having nausea, vomiting, diarrhea, diaphoresis, and lots of  dyspepsia.  She denies any congestive heart failure symptoms.  She  denies any eye, ear, nose, and throat symptoms.  She denies any neck  issues.  Denies any musculoskeletal issues.  Other than this recent  illness, she was fine until this morning.  All other organ systems  reviewed negative.   PHYSICAL EXAMINATION:  VITAL SIGNS:  Blood pressure 120/98, heart rate  32-100, respiratory rate 18, saturating 97-100% on room air, temperature  97.4.  GENERAL:  Alert and oriented x3.  PULMONARY:  Clear to auscultation.  CARDIAC:  Slow, regular.  Positive murmur noted.  OROPHARYNX:  Tongue is yellow and dry.  NECK:  No JVD.  ABDOMEN:  Tender diffusely, increased bowel sounds.  EXTREMITIES:  No edema.   ANCILLARY DATA:  Sodium 139, potassium 4.8, chloride 107, bicarb 26, BUN  14, creatinine 0.8, glucose 226.  White count 12.5, hemoglobin 13.6,  platelet could not be told, 78% segs.   Ionized calcium 1.15, AST 57, ALT  21, alk phos 33, total bilirubin 2.8, direct bilirubin 0.7, indirect  2.1.  Urinalysis, negative CK and troponin negative.  Chest x-ray is  clear.  No acute cardiopulmonary disease.  KUB shows calcified  gallstones, no obstruction.  EKG shows left bundle-branch block, heart  rate 90.  Another EKG shows heart rate in the 50s.  Ultrasound shows  gallstones, positive gallbladder wall thickening, increased ductal size  and diameter.   ASSESSMENT:  This is an elderly lady, being admitted with nausea,  vomiting, diarrhea related to food.  Ultrasound compatible with  gallstones and choledocholithiasis and lipase just came back at 7400  compatible with gallstone pancreatitis.  She is also having other  symptoms to include vasovagal bradycardia, presyncope, and has elevated  AST and total bilirubin.   PLAN:  1. Admit.  2. N.p.o.  3. Hold beta-blocker  4. Hold most medications, watch for congestive heart failure, restart      as able.  5. Low threshold for Cardiology consult.  6. Because of positive gallstones and felt to be the cause of the      symptoms, we will get surgery involved.  We will do antibiotics for      now and we will give IV fluids and symptomatic treatment.  7. Insulin sliding scale, NovoLog as written.  8. DVT prophylaxis.  9. If does not get better, consider CT of the abdomen and pelvis.      Gwen Pounds, MD  Electronically Signed     JMR/MEDQ  D:  12/23/2008  T:  12/24/2008  Job:  [id number]

## 2010-09-09 NOTE — Cardiovascular Report (Signed)
NAME:  [name], [name] NO.:  [id number]   MEDICAL RECORD NO.:  [id number]          PATIENT TYPE:  OIB   LOCATION:  1967                         FACILITY:  [facility]   PHYSICIAN:  Marca Ancona, MD      DATE OF BIRTH:  12-24-1933   DATE OF PROCEDURE:  08/28/2008  DATE OF DISCHARGE:  08/28/2008                            CARDIAC CATHETERIZATION   PRIMARY CARE PHYSICIAN:  Geoffry Paradise, MD   PRIMARY CARDIOLOGIST:  Pricilla Riffle, MD, [facility]   PROCEDURE:  1. Left heart catheterization.  2. Coronary angiography  3. Left ventriculography.   INDICATIONS:  Cardiomyopathy of uncertain etiology.   PROCEDURE NOTE:  After informed consent was obtained, the right groin  was sterilely prepped and draped.  Lidocaine 1% was used to locally  anesthetize the right groin area.  The right common femoral artery was  accessed using Seldinger technique and a 4-French arterial sheath was  placed.  The left coronary artery was engaged using the 4-French JL-4  catheter.  The right coronary artery was engaged using the 4-French 3DRC  catheter and the left ventricle was entered using the 4-French angled  pigtail catheter.  There are no complications.   FINDINGS:  1. Hemodynamics, LV 149/18/29, aorta 144/55.  2. Left ventriculography.  EF was estimated at 35% with moderate      global hypokinesis.  This appeared worse towards the apex with the      true apex appearing severely hypokinetic to akinetic.  It does not      appear dyskinetic.  There was 2 to 3+ mitral regurgitation.  3. Coronary angiography.  The coronary system is right dominant.  The      RCA has a 30% proximal stenosis, otherwise luminal irregularities      throughout its extent.  The left main is free from significant      disease.  The LAD and the left circumflex are small caliber      vessels.  The LAD has a 40% stenosis just after the first diagonal,      otherwise, luminal irregularities.  The left circumflex has mild      luminal irregularities.   IMPRESSION:  Left heart catheterization shows no obstructive coronary  artery disease.  The patient does have moderately depressed LV systolic  function with an EF estimated to be around 35%.  She has moderate mitral  regurgitation as well.  This appears to be a nonischemic cardiomyopathy.  Her LVEDP is significantly elevated at 29  mmHg.  We will plan on going ahead and starting Lasix at 20 mg daily  today.  We will add potassium chloride 20 mEq daily as well.  I will  have her stop Inderal and instead begin on Coreg 6.25 mg twice a day and  also we will add a low-dose ACE inhibitor with lisinopril 5 mg daily.      Marca Ancona, MD  Electronically Signed     DM/MEDQ  D:  08/28/2008  T:  08/29/2008  Job:  [id number]   cc:   Geoffry Paradise, MD  Sherol Dade  Tenny Craw, MD, [facility]

## 2010-09-09 NOTE — Op Note (Signed)
NAMEJYLL, TOMARO NO.:  [id number]   MEDICAL RECORD NO.:  [id number]          PATIENT TYPE:  INP   LOCATION:  3306                         FACILITY:  [facility]   PHYSICIAN:  Gaynelle Adu, MD        DATE OF BIRTH:  [birth year]   DATE OF PROCEDURE:  12/27/2008  DATE OF DISCHARGE:                               OPERATIVE REPORT   PREOPERATIVE DIAGNOSIS:  Biliary pancreatitis.   POSTOPERATIVE DIAGNOSIS:  Biliary pancreatitis.   PROCEDURE:  Laparoscopic cholecystectomy   SURGEON:  Mary Sella. Andrey Campanile, MD.   ASSISTANT SURGEON:  Gabrielle Dare. Janee Morn, MD.   ANESTHESIA:  General.   SPECIMENS:  Gallbladder which was sent to pathology.   ESTIMATED BLOOD LOSS:  75 mL.   FINDINGS:  A short cystic duct; abnormal appearing liver - possible  cirrhosis.   INDICATIONS FOR PROCEDURE:  The patient is a 77 year old Caucasian  female who was admitted to the hospital on August 29 with sudden onset  abdominal pain associated with nausea and vomiting.  She said her pain  was mainly in the upper abdomen and right upper quadrant.  She had never  had symptoms like this before.  She underwent workup including an  ultrasound of her abdomen which revealed multiple stones within her  gallbladder, some questionable gallbladder wall thickening, and a mildly  enlarged common bile duct at 15 mm, however, there was no signs of  common bile duct stones.  She also had an elevated lipase of over 7000.  She was started on antibiotics and her lipase rapidly returned to normal  over the ensuing hospital days.  Her abdominal pain also resolved and  she was tolerating fluids.  She underwent MRCP to evaluate her ductal  anatomy.  There was no evidence of intrahepatic biliary dilatation and  there was no signs of common bile duct stone seen on MRCP.  I had an  extensive discussion with her and her family regarding laparoscopic  cholecystectomy with possible conversion to open.  The risks and  benefits were  discussed including bleeding, infection, injury to  surrounding structures, bile duct injury, biloma, abscess, need to  convert to open, perioperative complications including bleeding, DVT,  PE, myocardial infarction, stroke, and death.  She was evaluated by the  cardiologist given her history of nonischemic cardiomyopathy with an EF  of 25 to 35% and they cleared her for surgery as a moderate risk  patient.  She elected to proceed with surgery.   DESCRIPTION OF PROCEDURE:  The patient was brought to the operating  room, placed supine on the operating table.  General endotracheal  anesthesia was established.  Surgical time-out was performed.  Her  abdomen was prepped and draped in the usual standard surgical fashion.  She received antibiotics prior to skin incision.   A vertical infraumbilical incision was made approximately1-1/2 inches in  length after 0.25% Marcaine with 1% lidocaine had been infiltrated in  her skin and subcutaneous tissue.  The skin was incised with a 15 blade.  The subcutaneous tissue was spread with [employer].  The fascia was  grasped with Kochers x2 and lifted.  The fascia was incised with a 15-  blade and the peritoneal cavity was carefully entered, a 0 Vicryl on a  UR-6 needle was used to place the pursestring suture.  The Hasson trocar  was introduced and secured.  Pneumoperitoneum was established to a  patient pressure of 15 mmHg.  Once adequate pneumoperitoneum had been  achieved, the laparoscope was advanced and the abdominal cavity was  surveilled.  There was some evidence of blood-tinged ascites in her  upper abdomen.  We then placed her in the operating positioned in  reverse Trendelenburg and slightly rotated to the left.  We placed a 10-  mm trocar in her epigastrium under direct visualization after local had  been instilled.  We then placed two additional 5 mm trocars in the right  hypochondrium under direct visualization again after local had been   infiltrated.  The gallbladder was grasped and retracted toward her right  shoulder and the infundibulum was grasped and retracted laterally.  There was very few omental attachments to her gallbladder.  We  identified the neck of the gallbladder and the cystic duct.  We were  able to circumferentially dissect the cystic duct out.  However, the  cystic duct was very short.  We could identify the common bile duct as  well as the duodenum.  Once we had circumferentially dissected out the  cystic duct, I was able to see the posterior aspect of the lobe.  The  critical view was obtained.  We had also mobilized the gallbladder  medial and laterally with the electrocautery to make it easier to rotate  and into help with our circumferential dissection around the cystic  duct.  Two clips were placed from downside of the cystic duct and one on  the upside next to the gallbladder, endoshears were used to transect.  The cystic artery was directly medial and a clip was placed on it x2,  and it was then transected distally with electrocautery.  We then rolled  the gallbladder up the gallbladder fossa using silk electrocautery.  Some of the gallbladder was intrahepatic, we were eventually able to  free.  There was an intermittent association between the posterior  aspect of the gallbladder wall and the liver.  The gallbladder was  intrahepatic and the gallbladder was entered while mobilizing it.  There  was spillage of a few stones.  We proceeded with mobilizing the  gallbladder out and an Endobag was placed to the epigastric trocar and  the gallbladder was placed in the back prior to spillage of the stone.  The Endobag was then placed in the right upper quadrant.  The liver bed  was then reinspected.  There was some bleeding from the lateral edge of  the gallbladder fossa, this was irrigated, some of the stones that are  spilled were evacuated with a stone grabber.  We then used  electrocautery to obtain  hemostasis from the gallbladder bed.  After we  achieved hemostasis,  Surgicel was placed.  We then irrigated the right  upper quadrant with 3 liters of saline.  We then removed the gallbladder  from the umbilical trocar.  We reestablished pneumoperitoneum and  reinspected the right upper quadrant.  We then irrigated the right upper  quadrant again.  The irrigation solution was clear and was no longer  bloody.  We reinspected the gallbladder fossa.  There was no signs of  bleeding.  Pneumoperitoneum was released.  Another figure-of-eight  suture was placed at the fascia of the umbilical incision and it was  tied down, the pursestring suture was secured as well.  Pneumoperitoneum  was reestablished and there was no signs of bowel or omentum within our  fascial closure and there was no air leak.  Pneumoperitoneum was  released and the trocars removed under direct visualization.  The fascia  at the epigastric trocar was closed with a figure-of-eight suture.  The  subcutaneous tissue was irrigated and the skin incisions were closed  with 4 Monocryl in a subcuticular fashion followed by the application of  Benzoin, Steri-Strips, and bandages.  The patient was extubated and  taken to the recovery room in stable condition.  All counts were correct  x2.      Gaynelle Adu, MD  Electronically Signed     EW/MEDQ  D:  12/27/2008  T:  12/28/2008  Job:  [id number]

## 2010-09-09 NOTE — Discharge Summary (Signed)
[name], [name] NO.:  [id number]   MEDICAL RECORD NO.:  [id number]          PATIENT TYPE:  IPS   LOCATION:  4028                         FACILITY:  [facility]   PHYSICIAN:  Ranelle Oyster, M.D.DATE OF BIRTH:  07-12-1933   DATE OF ADMISSION:  10/05/2007  DATE OF DISCHARGE:  10/25/2007                               DISCHARGE SUMMARY   NEUROSURGERY:  Tia Alert, MD.   DISCHARGE DIAGNOSES:  1. Left frontal parietal craniotomy for arachnoid cyst.  2. Left extra-axial fluid collection.  3. Seizure prophylaxis.  4. Hyponatremia.  5. Diabetes mellitus.  6. Hypertension.  7. Hypothyroidism.   This is a 77 year old white female progressive decrease in memory with  unstable gait.  MRI showed a large arachnoid cyst in the frontal  temporal region with mass effect.  Underwent left craniotomy and  fenestration of cyst October 01, 2007, per Dr. Yetta Barre.  Placed on Dilantin  for seizure prophylaxis.  Followup cranial CT on October 02, 2007, with  decrease in midline shift, low grade temperature and monitored.   PAST MEDICAL HISTORY:  See discharge diagnoses.  No alcohol or tobacco.   ALLERGIES:  None.   SOCIAL HISTORY:  Lives alone.  Two local daughters and 4 sons.  Good  support from family.   MEDICATIONS PRIOR TO ADMISSION:  Fish oil daily, Inderal 80 mg daily,  Zocor 80 mg daily, and Synthroid daily.  Oral diabetic medications, the  patient cannot recall names.   HOSPITAL COURSE:  The patient with progressive gains while on rehab  services with therapies initiated on a 3-hour daily basis consisting of  physical therapy, occupational therapy, and rehabilitation nursing.  The  following issues were addressed during the patient's rehabilitation  stay.  Pertaining [name]'s left frontal parietal craniotomy for  arachnoid cyst.  The patient with progressive slow gains while on rehab  services.  She had remained on Dilantin for seizure prophylaxis, this  was  changed to Keppra October 07, 2007, due to some mild lethargy.   FOLLOWUP LABS:  Showed a sodium of 127 to 129.  She was placed on a  strict fluid restriction.  Sodium improved to 135 with time.  She had  been placed on low doses of Ritalin to help regain her focus and  attention to tasks.  Blood sugars monitored.  Her Glucophage had been  held due to some low blood sugar.  She remained on glyburide 10 mg twice  daily.  She remained on hormone supplement for hypothyroidism.  She had  completed a course of amoxicillin for urinary tract infection during her  rehabilitation stay.  Noted during her hospital course, the patient with  some wax and wane in her  mental status, monitoring of her sodium levels  were watched closely.  Her Dilantin had been discontinued due to her  lethargy and changed to Keppra with no seizure activity noted.  A  followup cranial CT scan was completed on October 07, 2007, that showed  some postoperative sequelae with staple to decreased intracranial  hemorrhage and decreased pneumocephalus.  There is  still a small midline  shift of 13 mm as compared to 11 mm.  She was continued with her  therapies.  As overall, she was variable where she was minimum-to-max  assist for her mobility, minimum-to-moderate assist for grooming upper  and lower body.  A followup CT of the head again was completed on October 21, 2007, that now showed interval increase in the left hemispheric  extra-axial fluid collection of 14.5 mm versus 8.5 mm in maximal  thickness.  Dr. Marikay Alar was consulted in regards to this cranial CT  scan and felt that left burr hole would have to be completed for  drainage of subdural fluid, this was to be arranged for October 25, 2007,  and all issues in regards to this procedure were discussed with the  family.  She remained medically stable during her rehab course and would  be discharged to the services of Dr. Marikay Alar on October 25, 2007.  She  would resume inpatient  rehab services if needed after procedure.      Mariam Dollar, P.A.      Ranelle Oyster, M.D.  Electronically Signed    DA/MEDQ  D:  10/24/2007  T:  10/24/2007  Job:  [id number]

## 2010-09-09 NOTE — Assessment & Plan Note (Signed)
[name] is back regarding her left frontal-parietal arachnoid cyst  with craniotomy and subsequent hydrocephalus.  She was on Rehab in June.  She was discharged home after a burr hole procedure to relieve pressure.  She has been receiving home health therapy and doing quite well.  She  denies pain.  She is walking without assistive device.  She still has  some short-term memory and problem solving deficits and high-level  balance problems, but overall has improved.  She has cooked some meals  with supervision of the family.  The patient is eager to drive.  She has  been sleeping well.  She is continent of bowel and bladder.   REVIEW OF SYSTEMS:  Notable for occasional high sugars.  Other pertinent  positives are above.  Full 14-point review is in the written health and  history section.   SOCIAL HISTORY:  The patient is widowed and lives with family currently,  but was living alone prior to her hospitalization.   PHYSICAL EXAMINATION:  VITAL SIGNS:  Blood pressure is 118/57, pulse is  80, respiratory rate 20, and she is saturating 96% on room air.  GENERAL:  The patient is pleasant, alert and oriented x3.  Affect is  bright.  She has good insight and awareness.  Emotionally, she was much  brighter in personal development than she had been previously.  HEART:  Regular.  CHEST:  Clear.  ABDOMEN:  Soft and nontender.  EXTREMITIES:  Gait is generally stable; though she walks with bit of  shuffling pattern.  She had mild difficulty with heel-to-toe balance.  NEUROLOGIC:  Coordination overall with fine motor movements was good.  Reflexes are 2+.  Sensory exam is intact.  Cranial nerve exam is normal.  Memory was 1/3 objects for 5 minutes.  She was able to sequence simple  tasks without significant delay.  She was able to do some simple problem-  solving today as well.   ASSESSMENT:  History of left frontal arachnoid cyst status post  craniotomy and burr hole drainage of hygroma.   PLAN:  1. The patient is doing extremely well at this point.  I am ecstatic      with her progress.  We can transition her to outpatient physical      therapy and speech to work on dynamic balance and high-level      cognitive function.  I do not believe she will need that long      perhaps 2 to 3 weeks.  She should continue with activities at home      per family.  I did not give her permission to drive today, but I      would see her being able to drive at least locally in a      month or so.  2. We will see her back in 4 weeks for followup.  She sees Dr. Yetta Barre      next week.      Ranelle Oyster, M.D.  Electronically Signed     ZTS/MedQ  D:  11/25/2007 11:25:23  T:  11/26/2007 01:59:28  Job #:  [id number]   cc:   Marikay Alar  [address]  [place]  [place] [address]   Geoffry Paradise, M.D.  Fax: [phone]

## 2010-09-09 NOTE — Consult Note (Signed)
NAME:  [name], [name] NO.:  [id number]   MEDICAL RECORD NO.:  [id number]          PATIENT TYPE:  IPS   LOCATION:  4028                         FACILITY:  [facility]   PHYSICIAN:  Tia Alert, MD     DATE OF BIRTH:  10/22/1933   DATE OF CONSULTATION:  10/22/2007  DATE OF DISCHARGE:                                 CONSULTATION   CHIEF COMPLAINT:  Left extra-axial fluid collection, status post  craniotomy for arachnoid cyst.   HISTORY OF PRESENT ILLNESS:  [name] is a 77 year old white female who  is very well-known to me, who is status post a left pterional craniotomy  for fenestration of arachnoid cyst.  She did fairly well following her  surgery.  She did have some early hyponatremia and was fluid restricted.  On the day that she was discharged to rehab, she did have a fall while  in the hospital while in the acute care unit, but followup head CT  showed no obvious immediate injury.  Since her time on rehabilitation,  she has had a waxing and waning course.  She has good days and bad days  according to the family.  She denies any headache, any numbness or  tingling, but they says she still has difficulty with her gait.  She  still requires assistance of a walker.  She had waxing and waning mental  status, especially as her sodium fluctuates.  She has been on fluid  restriction, since being on the rehab unit; however, there is some  documentation in the chart, that the family brings her drinks and did  not quite understand the concept.  Indeed the family tells me today that  they have known of no fluid restriction until the last day or two.  They  think that they have tried to reach my office on several occasions to  try to get me to come see the patient.  They state the nurses have tried  to do the same, unfortunately I have not known of that, those attempts  to contact either through my office or through the hospital.  I have  certainly received no pages that I  know regarding her until yesterday  and nobody tried to call me while in the operating room to come see her.  I received a call yesterday to come see her when her CT scan showed an  enlarging extra-axial fluid collection on the left.  I received one of  the call during the hospital stay to look at a cervical spine x-ray, but  never have been asked to come back and evaluate the patient.  I will  have to evaluate the office chart to see if there are notes left in that  chart for me to evaluate [name].  At any rate, I apologize to the  family profusely for any lack of communication or drop of communication  or the inability to get a hold of me to come see [name].  Obviously,  I was not trying to avoid caring for her or avoid coming back to see  her, as I came back promptly to do so today and I would have done so  before.  I have tried to explain this to them in great detail.  I do see  a note in the chart where they called Dr. Jordan Likes one Friday night while on  call.  He evaluated her CT scan and felt like she was being adequately  managed by the rehab physicians and did not come to see her.  I did not  know of that phone call or that interaction until I looked through the  chart today.  I spent well over an hour and a half talking to the family  about this and apologizing for the miscommunication or lack of  communication.  Though,  I believe quite strongly looking through the  chart and looking at the films, that it did not change her quality of  care or her level of care or delay her care in anyway.  As even if I had  been following her every day, we likely would not have done anything  about this extra-axial fluid collection until now, as we would have made  sure that it continued to grow or cause symptoms or cause problems  before we decided to approach this through a surgical intervention.   PHYSICAL EXAMINATION:  Today, she is awake and alert.  She is pleasant  and interactive.   She has good attention span.  She follows commands  briskly.  Her speech is fluent.  Her incision is well healed.  She has  no pronator drift.  She has a negative Hoffman sign. She has no clonus.  I have not seen her walk.  She is not spastic or hypertonic in anyway.  She has fairly good rapid alternating movements and she participated in  an hour of occupational therapy in this morning while I spoke with the  family.   Again, I spent over an hour and a half with several family members.  There were 5 family members in the room with me during this  conversation.  We once again discussed at length her preoperative  symptoms, which were mostly cognitive difficulties.  It was difficult to  know whether this was dementia or cognitive difficulties related to the  arachnoid cyst.  The apparent pressure caused by the arachnoid cyst on  the MRI scan, in the fact that she was having some visual difficulties,  made [place] think that this arachnoid cyst may be causing some of her  symptoms, but it was difficult to know whether this was a more common  dementia that was only somewhat worsened by the arachnoid cyst.  Given  her continued symptoms, I am starting to believe that she has underlying  dementia and the arachnoid cyst may have only contributed to this, but  may not have been the full cause, and therefore it is not truly or fully  treatable cause of dementia, and I have explained this to the family in  detail.  They also had questions about her course after surgery that it  was not what they expected, and I explained to them again that this  was a very large arachnoid cyst and there was great concern on my part  whether or not she would survive the surgery, given her advanced age and  the size of the cyst and I was worried about a rapid pressure change  during the surgery.  However, she did quite nicely with that.  However,  she did  require a rehab stay rather than discharge home.  I told them   that was not necessarily uncommon in a woman greater than 51 years old,  who has craniotomy for such complex lesion.   She has also had other problems such as urinary tract infection and  hyponatremia, which was added to her slow recovery.  Also, if she has  dementia that is unrelated to the arachnoid cyst, that will certainly  slow her recovery.  Again, all of this was explained to them in detail  and was explained to them prior to surgery, though the daughter states  that she does not recall any of that conversation.  I do have it  documented in the office chart.  I also talked about typical recoveries  after surgery and typical outcomes, but explained once again that these  are typical and not promised, that everybody recovers differently.  These things were explained again in detail and they seemed to  demonstrate understanding of this.  I certainly share their concern with  the lack of communication or the drop of communication and it should not  be that difficult to get in touch with me.  I did give them my cell  phone number, so they can reach me directly should they need me, but  again explained to them that it is very simple for anybody who needs me  to simply page me through the hospital operator or through my office or  through our answering service.   I did go over the CT scan with them in detail, explaining the new  findings.  She has got now 1-1/2 cm extra-axial fluid collection.  The  differential diagnosis for this includes a chronic subdural hematoma  versus a subdural hygroma versus external hydrocephalus.  Her ventricles  are somewhat larger than they were prior to surgery, but that may simply  be a change in pressure phenomenon as that they are less compressed by  the pressure of the arachnoid cyst.  The area of the arachnoid cyst is  unchanged, but I would expect that.  It is difficult to know the cause  of the extra-axial fluid collection, it could simply be because  of the  pressure change.  Now the brain sags away from the left parietal region  and allows a potential space for this fluid to develop and also could be  from her fall, she could have had a small subdural hematoma at that  time, that it is now turned into a chronic subdural hematoma or it could  be external hydrocephalus, where it communicates with the cyst.  This is  all difficult to know at any rate.  I think it needs treatment.  I think  it needs surgical treatment in the form of a bur hole for treatment.  I  think this is an appropriate initial step, this will help [place] to diagnose  what the lesion is and allow placement of the subdural catheter.  Hopefully, this will allow for brain reexpansion and resolution of the  extra-axial fluid collection.  Should it not resolve, then she may need  a redo craniotomy for resolution, and if it turns out to be external  hydrocephalus or if she has recurrent subdural fluid collections after  that, she might be subdural shunting.  All of this was explained to them  in detail.  However, I think bur hole is a very reasonable first step  here and offer her the greatest potential benefit with the  least amount  of risk.  She would then be in the ICU for several days with a subdural  catheter in place with serial CT scans, look for resolution of the  fluid.   Her discharge plan after that would be difficult for me to predict at  this point.  Whether she would need to return to rehab or a skilled  nursing facility or will be discharged to home would depend on how she  did following the procedure.  I do worry about underlying dementia as  the cause of her continued cognitive problems, and I am not sure that  the subdural hematoma or hygroma is symptomatic at this time, though I  think it will become symptomatic over time if it continues to grow and  it remains untreated.  All of this has been explained to them in detail  and they have demonstrated  understanding.  I do not think this is an  emergent or urgent procedure and I plan on doing this in about 3 days  when the operating room is available.  I will follow her until the time  of the surgery.  The family seems satisfied when we finished our  conversation.  Again, I profusely apologized for their inability to  reach me and now gave them my cell phone number, so they can reach me  anytime they need to, but asked them not to abuse that luxury.  Though  certainly, I feel that they are justifiably upset by the poor  communication.  However, all I can do is change going forward and try to  make it, so that they can get in touch with me much more easily, but  again I am perplexed as to the difficulty reaching me for the last 3  weeks, which they stated they have tried multiple times.  We will plan  on bur hole evacuation of the extra-axial fluid collection on the left  in about 3 days.      Tia Alert, MD  Electronically Signed     DSJ/MEDQ  D:  10/22/2007  T:  10/23/2007  Job:  [id number]

## 2010-09-09 NOTE — Consult Note (Signed)
NAME:  Jane, Arroyo NO.:  [id number]   MEDICAL RECORD NO.:  [id number]          PATIENT TYPE:  INP   LOCATION:  4738                         FACILITY:  [facility]   PHYSICIAN:  Gaynelle Adu, MD        DATE OF BIRTH:  Mar 14, 1934   DATE OF CONSULTATION:  12/24/2008  DATE OF DISCHARGE:                                 CONSULTATION   REQUESTING PHYSICIAN:  Geoffry Paradise, MD   HISTORY OF PRESENT ILLNESS:  Jane. Rathel is a pleasant 77 year old white  female who presented yesterday to the emergency department with  complaint of severe sudden-onset abdominal pain associated with nausea  and vomiting and diarrhea.  She states that she was at a family member's  house and had eaten lunch and approximately an hour and a half  afterwards developed the sudden onset of upper abdominal and right upper  quadrant pain again with associated nausea, vomiting, and loose bowel  movements.  She subsequently came in to the emergency problem and was  evaluated and found to have evidence of gallstones and pancreatitis.   The patient states that she has never had symptoms like this before, but  there was no associated hematemesis or melena.  The patient denies fever  but reports having chills and sweats.  She has since been admitted and  has been on some antibiotics and IV fluids as well as pain medicine and  states she feels considerably better since yesterday.  She at present  time has minimal pain and is not having any nausea and vomiting and  although she had been made n.p.o.   PAST MEDICAL HISTORY:  Significant for a diabetes mellitus,  hypertension, hyperlipidemia, questionable heart failure, and coronary  disease.  The patient states approximately 4 months ago or 3 months ago  she had an episode of tachycardia and was subsequently taken for cardiac  cath which showed nonobstructive coronary disease but also apparently  had an ejection fraction or some type of echo performed back in  April  which showed a diminished ejection fraction of approximately 25%.  She  also has mild aortic and mitral regurgitation.  She states that she has  not had any cardiac issues since that time.  She also has a history of a  subarachnoid cyst that apparently measured approximately 3 inches and  had a left craniotomy and surgical dissection of the arachnoid cyst by  Dr. Yetta Barre again in June 2009.  She subsequently had a little bit of a  fall postoperatively and had to come back for a subdural hygroma and  placement of a subdural drain which apparently went well.  Since that  time, she has not had any significant issues neurologically.  The  patient has had the previously mentioned cardiac catheterization was  found on Aug 28, 2008, and showed ejection fraction of 25-35% with global  hypokinesis.  It again showed nonrestrictive coronary artery disease.  This was done by Dr. Shirlee Latch and reviewed by Dr. Dietrich Pates, the  patient's primary cardiologist.   SOCIAL HISTORY:  The patient lives alone.  She  does have a daughter and  one son.  They live within town and help her with her care.  She denies  alcohol or tobacco use.   ALLERGIES:  No known drug allergies but does report significant  dermatologic reaction to LATEX.   MEDICATIONS:  As noted in the medication reconciliation section of the  patient's chart.   REVIEW OF SYSTEMS:  The patient denies chest pain, shortness of breath.  Denies fever or chills, weight loss.  Denies hematemesis, melena,  hematochezia.  Denies dysuria, hematuria.  Denies skin rashes or  changes.  Denies any neurologic symptoms or deficits.   PHYSICAL EXAMINATION:  VITAL SIGNS:  Temperature currently 97.1, heart  rate 101, respirations 20, blood pressure 146/77.  The patient is 93% O2  on room air.  GENERAL APPEARANCE:  A 77 year old female in no acute distress, nontoxic  appearing, no jaundice.  ENT:  Unremarkable.  LUNGS:  Clear to auscultation.  No wheeze,  rhonchi, or rales.  HEART:  Regular rate and rhythm.  No murmurs, gallops, or rubs.  ABDOMEN:  Soft, flat, nondistended.  There is mild tenderness in the  right upper quadrant epigastric region.  No rebound or guarding to  suggest peritonitis.  No surgical scars are noted.  EXTREMITIES:  Within normal limits with regards to range of motion.  There is no significant edema.  No skin rashes or lesions to be  appreciated.  NEUROLOGIC:  The patient is alert and oriented x3.  Cranial nerves II  through XII are grossly intact.   DIAGNOSTICS/LABORATORY DATA:  CBC revealed a white blood cell count 8.8,  hemoglobin of 14.9, hematocrit of 42.9, platelet count of 170.  CMP  reveals sodium of 142, potassium 4.5, chloride 107, CO2 26, BUN 14,  creatinine 0.8, glucose 278.  Total bilirubin of 2.8, alkaline  phosphatase 33, AST 39, ALT 25, lipase 709 reduced from 7448.   Ultrasound of the abdomen shows multiple gallstones present with  suggestion of gallbladder wall thickening measuring approximately 5 mm.  No pericholecystic fluid or Murphy sign was elicited.  Common bile duct  was tapered appearance with dilatation measuring as much as 15 mm but no  definite calculi were identified or showing any signs of obstruction.   ASSESSMENT:  1. Cholelithiasis.  2. Biliary pancreatitis.  3. Hypertension.  4. Hyperlipidemia.  5. Diabetes mellitus.  6. Hypokinesis with reduced ejection fraction secondary to unknown      cardiomyopathy.   PLAN:  Given the patient's clinical improvement with a reduced lipase  and white blood cell count, feel can hold off on any further diagnostic  imaging at present with a CT or ERCP.  Feel the patient would be  appropriate for laparoscopic cholecystectomy with intraoperative  cholangiogram in the next few days as her enzymes continue to drive to  normal.  Questioned the possible need for cardiac clearance given the  patient's underlying history of diminished ejection  fraction prior to  surgery later this week.  Feel this may be appropriate as the patient  will need general anesthesia.   The patient will be seen and assessed by Dr. Gaynelle Adu.   I agree with the history and exam as dictated above by the physician  assistant.  I saw & examinded [name] & reviewed her labs, medical  record, & ultrasound.  Abdomen was soft, nondistended & mildly tender to  palpation.  No icterus or jaundice.  Gallstone Pancreatitis.  Will plan  laparoscopic cholecystectomy, possible open later in  the week as her  pancreatitis resolves.  If her LFTs increase will plan GI medicine  consult for preoperative ERCP.  I  discussed the nature & etiology of gallstone pancreatitis with the  patient & her son.  I also discussed laparoscopic cholecystectomy & the  risks/benefits including bleeding, infection, need to convert to open  procedure, injury to surrounding structures, biloma, abscess, incisional  hernia, common bile duct injury & the risks of general anesthesia.      Brayton El, PA-C      Gaynelle Adu, MD  Electronically Signed    KB/MEDQ  D:  12/24/2008  T:  12/24/2008  Job:  [id number]

## 2010-09-09 NOTE — Assessment & Plan Note (Signed)
[name] is back regarding her left frontoparietal arachnoid cyst with  craniotomy and subsequent hydrocephalus.  She has been in outpatient  therapy and seems to be nearing discharge.  There was some confusion  over whether she is finished with PT yet.  She continues to do well at  home.  She has been at home for periods during the day without issues.  The patient wants to return to driving.  The patient denies pain.  She  has had no falls.  The family notes occasional memory lapses with more  trivial types of information.  She is incontinent of bowel and bladder.  No safety issues, however, have been seen at home.   REVIEW OF SYSTEMS:  Notable for the above.  Full review is in the 14-  point review of systems in the health and history section of the chart.   SOCIAL HISTORY:  Noted above.  Family is present with her today.   PHYSICAL EXAMINATION:  VITAL SIGNS:  Blood pressure is 134/42, pulse 75,  respiratory rate 18, and she is sating 98% on room air.  GENERAL:  The patient is pleasant, alert, and oriented x3.  Affect is  bright and appropriate.  She displays good insight and awareness.  Affect is bright.  She is well dressed and wearing makeup.  HEART:  Regular.  CHEST:  Clear.  ABDOMEN:  Soft and nontender.  EXTREMITIES:  The patient walks with normal gait pattern today.  We  tried heel-toe movement and she had some mild loss with weightbearing on  the right.  She stood in the left leg without significant problems and  had some way out balance lost while standing on the right side.  She,  otherwise, has good fine motor movements over the legs and  arms.  NEUROLOGIC:  Sensory exam is normal.  Reflexes are 2+.  Motor function  is generally 5/5.  From a cognitive standpoint, she is alert and  oriented to the date.  She had some difficulty sequencing numbers and  doing some maths/algebra calculations.  She seemed to show generally  good safety judgment and environmental awareness.   ASSESSMENT:  History of left frontal arachnoid cyst status post  craniotomy and burr-hole drainage of hygroma.   PLAN:  The patient is doing extremely well.  Some of her mathematical  issues are premorbid per the family.  Some of her trivial day-to-day  memory issues should improve with time and are significant at this  point.  I believed she is safe to be at home alone.  Family will  liberalize her activities in that manner.  I suggested trying some  driving with the family in an open parking lot on the early morning and  then begin to let her drive with a companion for a time or two to see  how she does.  If there are any red flags, we can send her to [facility].  Otherwise, if she is allowed to drive, she should  restrict her driving to the hours of 10 a.m. to 3 p.m. approximately  during the day to avoid traffic.  Driving should be local as well.   I will see her on an as needed basis in the future.  I will check with  physical therapy to make sure there is no further work they would like  to do with her before discharge.      Ranelle Oyster, M.D.  Electronically Signed     ZTS/MedQ  D:  12/23/2007 10:37:19  T:  12/24/2007 01:59:42  Job #:  [id number]   cc:   Tia Alert, MD  Fax: [phone]   Geoffry Paradise, M.D.  Fax: [phone]

## 2010-09-09 NOTE — Discharge Summary (Signed)
Jane, Arroyo NO.:  [id number]   MEDICAL RECORD NO.:  [id number]          PATIENT TYPE:  INP   LOCATION:  3028                         FACILITY:  [facility]   PHYSICIAN:  Tia Alert, MD     DATE OF BIRTH:  07-17-1933   DATE OF ADMISSION:  10/25/2007  DATE OF DISCHARGE:  10/31/2007                               DISCHARGE SUMMARY   ADMISSION DIAGNOSIS:  Left subdural extra-axial fluid collection  consistent with hygroma.   PROCEDURE:  Bur hole for drainage of subdural hygroma.   BRIEF HISTORY OF PRESENT ILLNESS:  [name] is a 77 year old female who  was readmitted from the rehab floor on October 25, 2007 after CT scan  showed an enlarging extra-axial fluid collection on the left consistent  with either subdural hygroma or subdural hematoma.  She was actually  doing quite well neurologically and doing well in therapy, but given the  growing nature of the lesion I recommended a bur hole for evacuation of  the lesion with placement of a subdural drain.  The patient and family  understood the risks, benefits, expected outcome, and wished to proceed.   DESCRIPTION OF HOSPITAL COURSE:  The patient was admitted on October 25, 2007 and was taken to the operating room where she underwent a bur hole  for evacuation of the left subdural hygroma.  The patient tolerated the  procedure well and was taken to the recovery room and then to the ICU in  stable condition.  For details of the operative procedure, please see  the dictated operative note.  The patient's hospitalization was routine  after that.  There were no complications.  Her incision remained clean,  dry, and intact.  She was raised her head of bed to 10 degrees on postop  day #1 and then to 20 degrees on postop day #2 and then she was  mobilized on postop day #3 at which time her subdural drain was  discontinued.  She had 2 followup head CTs which showed no real change  and the extra-axial fluid collection  except for some pneumocephalus but  no re-expansion of the brain into the space at that point, but  neurologically she was doing well, she was awake and alert, she was  following commands, she had some mild memory issues but otherwise had a  nonfocal neurologic exam.  She was transferred to the floor where she  continued to do well.  She continued to work with physical and  occupational therapy.  She was using a walker to ambulate.  She was  tolerating a regular diet and using no pain medications.  We had her  reevaluated by rehab, but she was felt to be doing too well for  rehabilitation and insurance would not pay for another stay in  rehabilitation.  Therefore, it was recommended that she be discharged to  home with home health aide and physical and occupational therapy.  This  was arranged and she was discharged home in stable condition on October 31, 2007 with plans to follow up with me in  2 weeks.   DISCHARGE MEDICATIONS:  1. DiaBeta.  2. Synthroid.  3. Propranolol.  4. Keppra.  5. Ritalin.  6. Darvocet-N 100.   FOLLOWUP:  Her return appointment was in 2 weeks.   FINAL DIAGNOSIS:  Subdural hygroma.      Tia Alert, MD  Electronically Signed     DSJ/MEDQ  D:  10/31/2007  T:  11/01/2007  Job:  [id number]

## 2010-09-09 NOTE — Discharge Summary (Signed)
NAME:  [name], [name] NO.:  [id number]   MEDICAL RECORD NO.:  [id number]          PATIENT TYPE:  INP   LOCATION:  3023                         FACILITY:  [facility]   PHYSICIAN:  Tia Alert, MD     DATE OF BIRTH:  29-Sep-1933   DATE OF ADMISSION:  09/28/2007  DATE OF DISCHARGE:  10/05/2007                               DISCHARGE SUMMARY   ADMITTING DIAGNOSIS:  Left intracranial arachnoid cyst.   PROCEDURE:  Left craniotomy for arachnoid cyst.   BRIEF HISTORY OF PRESENT ILLNESS:  Ms. Lafavor is a 77 year old female who  presented to the office with memory disturbance.  She had an MRI which  showed a large left frontotemporal fossa arachnoid cyst with significant  midline shift and compression of the ventricular system.  It was  difficult to know she had underlying dementia or if this was a  reversible dementia caused by the arachnoid cyst.  I recommended a  craniotomy for fenestration of the cyst.  The patient and the family  understood the risks, benefits, expected outcome, and wished to proceed.   HOSPITAL COURSE:  The patient was admitted on September 28, 2007, and taken to  the operating room, where she underwent a fenestration of her left  intracranial arachnoid cyst.  The patient tolerated the procedure well  and was taken to the recovery room and then to the neurosurgical ICU in  stable condition.  The patient's hospital course was fairly routine.  She did have a late urinary tract infection, which was treated prior to  discharge to rehab facility.  She remained awake and alert, pleasant,  interactive, though she still had memory disturbance.  She was slow to  mobilize.  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  She denied headache and seemed to be  moving extremities well.  When she first woke from surgery, she did have  some aphasia and it was felt to be secondary to simple cortical  irritation.  She was started  on antiepileptics in case this was a TIA-  like syndrome.  She continued to progress and do nicely, and was  discharged to the rehab facility here in the hospital on October 05, 2007.  The plan is to follow up in 2 weeks upon discharge.   FINAL DIAGNOSIS:  Craniotomy for arachnoid cyst.      Tia Alert, MD  Electronically Signed     DSJ/MEDQ  D:  11/25/2007  T:  11/26/2007  Job:  [id number]

## 2010-09-12 NOTE — Discharge Summary (Signed)
[facility]  Patient:    [name], [name]                          MRN: [id number] Adm. Date:  [id number] Disc. Date: [id number] Attending:  Frederich Balding                           Discharge Summary  ADMISSION DIAGNOSIS:  Symptomatic pelvic relaxation.  DISCHARGE DIAGNOSIS:  Symptomatic pelvic relaxation.  OPERATIVE PROCEDURE:  Anterior and posterior colporrhaphy.  Sacrospinous ligament suspension.  Placement of suprapubic catheter.  HISTORY:  For complete history and physical, please see dictated note.  COURSE IN THE HOSPITAL:  Patient underwent the above-noted surgery. Postoperatively, the patient did well.  Her postop hemoglobin was 12.1.  She was discharged home on her second postoperative day.  At that point in time, she had begun voiding trials.  She was voiding small amounts with fairly large residuals.  She was tolerating a regular diet, had passed flatus but had not had a bowel movement.  Her perineum was intact and she had no active vaginal bleeding.  COMPLICATIONS:  In terms of complications, none encountered during stay in hospital and patient discharged home in stable condition.  DISPOSITION:  Patient will continue clamping her suprapubic at home, measuring voiding and postvoid residuals.  We will reassess her bladder function on Friday in the office and determine whether the suprapubic can be removed.  DISCHARGE MEDICATIONS:  She will continue on Tylox as needed for pain.  We will put her on Keflex for bladder suppression.  She will resume all previous medications that she had been taking.  ACTIVITY:  In terms of limitation, she is to avoid heavy lifting, vaginal entrance or driving a car.  SPECIAL INSTRUCTIONS:  She is to watch for signs of infection, active vaginal bleeding, increasing abdominal pain or nausea or vomiting and again, assessment in the office will be on Friday. DD:  09/10/99 TD:  09/11/99 Job:  [id number]

## 2010-09-12 NOTE — Op Note (Signed)
[facility]  Patient:    [name], [name]                          MRN: [id number] Proc. Date: 09/08/99 Adm. Date:  [id number] Attending:  Frederich Balding                           Operative Report  PREOPERATIVE DIAGNOSIS:  Symptomatic pelvic relaxation with associated cystocele, rectocele and vaginal vault prolapse.  POSTOPERATIVE DIAGNOSIS:  Symptomatic pelvic relaxation with associated cystocele, rectocele and vaginal vault prolapse.  OPERATION PERFORMED:  Anterior and posterior colporrhaphy with sacrospinous ligament suspension, placement of a suprapubic catheter.  SURGEON:  Juluis Mire, M.D.  ASSISTANT:  Duke Salvia. Marcelle Overlie, M.D.  ANESTHESIA:  General endotracheal.  ESTIMATED BLOOD LOSS:  300 cc.  PACKS AND DRAINS:  None.  INTRAOPERATIVE BLOOD REPLACED:  None.  COMPLICATIONS:  None.  INDICATIONS FOR PROCEDURE:  Dictated in the history and physical.  DESCRIPTION OF PROCEDURE:  The patient was taken to the operating room and placed in supine position.  After satisfactory level of general endotracheal anesthesia was obtained, the patient was placed in dorsal lithotomy position using Allen stirrups.  The lower abdomen, perineum and vagina were prepped out with Betadine and draped as a sterile field.  Examination under anesthesia revealed a marked cystocele, marked rectocele and partial vaginal vault prolapse.  Cervix and uterus were surgically absent.  No palpable pelvic masses were noted.  A Foley catheter was placed to straight drain.  The top of the vaginal cuff was identified and grasped with Alliss.  A transverse incision was made with a knife.  Next, the vaginal mucosa was undermined in the midline and incised up to approximately 1 m below the urethra.  Next the pubocervical vaginal fascia was dissected from the  overlying vaginal mucosa.  Kelly plication stitches of 2-0 was put in place and  secured with good elevation of  the urethrovesical angle.  Next, a cystocele was  obliterated with interrupted sutures of 2-0 Vicryl.  The edges of the vagina were trimmed and reapproximated in the midline with 2-0 Vicryl.  We had excellent hemostasis and good reduction of the cystocele.  Next, the incision was made over the perineal body and brought anteriorly to the vaginal opening and incised.  The vaginal mucosa was undermined in the midline p to the top of the vaginal cuff and incised.  We then dissected off the perirectal fascia from the overlying vaginal mucosa.  Next, the colon was dissected to the  left.  We then identified the sacral spinous ligament.  A 0 Prolene suture was ut in place using the chute suture passer.  It was placed approximately 2 cm medial to the ischial tuberosity.  We had excellent purchase to the sacral spinous ligament. This suture was then secured to the top of the vaginal cuff in a helical fashion. Next, the rectocele was obliterated with interrupted sutures of 2-0 Vicryl.  We  began reapproximating the vaginal mucosa.  Next, the sacral spinous ligament suspension was tied down with good elevation of vaginal cuff to the ligament. he edge of the vaginal mucosa was trimmed and brought together in the midline with  interrupted sutures of 2-0 Vicryl.  The perineal body was rebuilt with 2-0 Vicryl. The skin over the perineal body was closed with 2-0 Vicryl.  A vaginal pack was put in place.  Rectal exam  was unremarkable.  Next, the bladder was filled through he Foley catheter.  A Bonanno superpubic catheter was put in place and secured to he skin.  The urethral Foley catheter was then removed.  The patient was taken out of the dorsal lithotomy position, was extubated, alert and transferred to the recovery room in good condition.  Sponge and instrument counts were reported as correct y the circulating nurse x 2. DD:  09/08/99 TD:  09/09/99 Job: [id number]

## 2010-09-12 NOTE — H&P (Signed)
[facility]  Patient:    [name], [name]                          MRN: [id number] Adm. Date:  09/08/99 Attending:  Juluis Mire, M.D.                         History and Physical  HISTORY OF PRESENT ILLNESS:  A 77 year old gravida 6, para 6, postmenopausal white female presents for anterior and posterior repair and sacrospinous ligament suspension for management of symptomatic pelvic relaxation.  In relation to the present admission, the patient was seen in our office in February of the last year.  She had had a previous total vaginal hysterectomy and bladder suspension in 1983.  She was having at that time increasing pelvic pressure.  Was on hormone replacement therapy in the form of Ortho-Est 0.625 mg.  Evaluation did reveal a marked severe cystocele, moderate rectocele, and mild vaginal cuff prolapse.  Bimanual exam was otherwise unremarkable.  She had no significant urinary symptoms as in urinary leakage.  We placed her on estrogen cream and continued to watch her conservatively; however, she had increasing pressure and discomfort from the bladder prolapse, and this was becoming limiting in terms of her activities.  In view of these, options were discussed, including management with pessary, continued conservative follow-up versus surgical repair, which the patient presents for at the present time for management.  ALLERGIES:  No known drug allergies.  MEDICATIONS:  Ortho-Est 0.625 mg, propranolol SA 80 mg, and Synthroid 100 mcg per day.  PAST MEDICAL HISTORY:  Patient does have a history of hypothyroidism, for which she is on Synthroid as noted.  This is managed by Dr. Jacky Kindle. Otherwise, usual childhood diseases without any significant sequelae.  PAST SURGICAL HISTORY:  She had a goiter removed in 1969.  She had a hysterectomy in 1983 and a cyst removed from the sacrum in 1952.  OBSTETRICAL HISTORY:  She has had six spontaneous vaginal  deliveries.  FAMILY HISTORY:  Noncontributory.  SOCIAL HISTORY:  No tobacco or alcohol use.  REVIEW OF SYSTEMS:  Noncontributory.  PHYSICAL EXAMINATION:  VITAL SIGNS:  The patient is afebrile with stable vital signs.  HEENT:  The patient is normocephalic.  Pupils equal, round and reactive to light and accommodation.  Extraocular movements were intact.  Sclerae and conjunctivae clear.  Oropharynx clear.  NECK:  Without thyromegaly.  BREASTS:  No discrete masses.  LUNGS:  Clear.  CARDIOVASCULAR:  Regular rhythm and rate without murmurs or gallops.  ABDOMEN:  Benign, no mass, organomegaly, or mass.  PELVIC:  Does have a fairly significant cystocele, moderate rectocele, mild vaginal cuff prolapse.  Uterus and cervix are surgically absent.  Bimanual exam is unremarkable.  Rectovaginal exam was clear.  EXTREMITIES:  Trace edema.  NEUROLOGIC:  Basically within normal limits.  IMPRESSION:  Symptomatic pelvic relaxation.  PLAN:  The patient will undergo anterior and posterior repair with sacrospinal ligament fixation.  The risks of surgery have been discussed, including the risk of recurrent prolapse in the future that could require further surgical management.  The risk of infection.  The risk of hemorrhage can necessitate transfusion with the risk of AIDS or hepatitis.  The risk of injury to adjacent organs, including bladder, bowel, or ureters that could require further exploratory surgery.  The risk of deep venous clots and pulmonary emboli.  The patient expressed an understanding  of indications and risks. DD:  09/08/99 TD:  09/08/99 Job: [id number]

## 2010-09-24 ENCOUNTER — Encounter: Payer: Self-pay | Admitting: Internal Medicine

## 2010-10-17 ENCOUNTER — Telehealth: Payer: Self-pay | Admitting: *Deleted

## 2010-10-17 NOTE — Telephone Encounter (Signed)
Called patient to make sure that she lowered her dose of Zocor to 40mg  every day back in April. She states that she had done this. She will have repeat lab work in August at [facility] and have them forward a copy to [facility].

## 2010-10-30 ENCOUNTER — Encounter: Payer: Self-pay | Admitting: Internal Medicine

## 2011-01-21 LAB — BASIC METABOLIC PANEL WITH GFR
BUN: 12
CO2: 29
Calcium: 10
Chloride: 102
Creatinine, Ser: 0.82
GFR calc non Af Amer: >60
Glucose, Bld: 200 — ABNORMAL HIGH
Potassium: 4.5
Sodium: 140

## 2011-01-21 LAB — DIFFERENTIAL
Basophils Absolute: 0.1
Basophils Relative: 1
Eosinophils Absolute: 0.1
Eosinophils Relative: 1
Lymphocytes Relative: 25
Lymphs Abs: 1.4
Monocytes Absolute: 0.5
Monocytes Relative: 9
Neutro Abs: 3.6
Neutrophils Relative %: 65

## 2011-01-21 LAB — PROTIME-INR: Prothrombin Time: 13.3

## 2011-01-21 LAB — CBC
Hemoglobin: 14.1
MCHC: 34.8
Platelets: 182
RDW: 13.3

## 2011-01-21 LAB — APTT: aPTT: 34

## 2011-01-21 LAB — TYPE AND SCREEN: ABO/RH(D): A NEG

## 2011-01-22 LAB — BASIC METABOLIC PANEL
BUN: 10
BUN: 13
BUN: 9
BUN: 8
BUN: 9
BUN: 9
BUN: 6
CO2: 25
CO2: 26
CO2: 26
CO2: 27
CO2: 27
CO2: 30
CO2: 30
CO2: 31
CO2: 29
CO2: 29
CO2: 30
CO2: 32
Calcium: 9.4
Calcium: 9.8
Calcium: 9.1
Calcium: 9.2
Calcium: 9.1
Chloride: 107
Chloride: 91 — ABNORMAL LOW
Chloride: 92 — ABNORMAL LOW
Chloride: 95 — ABNORMAL LOW
Chloride: 99
Chloride: 93 — ABNORMAL LOW
Chloride: 95 — ABNORMAL LOW
Chloride: 100
Chloride: 101
Chloride: 102
Chloride: 104
Chloride: 99
Creatinine, Ser: 0.62
Creatinine, Ser: 0.64
Creatinine, Ser: 0.64
Creatinine, Ser: 0.65
Creatinine, Ser: 0.87
Creatinine, Ser: 0.66
Creatinine, Ser: 0.68
Creatinine, Ser: 0.73
Creatinine, Ser: 0.72
GFR calc Af Amer: >60
GFR calc Af Amer: >60
GFR calc Af Amer: >60
GFR calc Af Amer: >60
GFR calc Af Amer: >60
GFR calc Af Amer: >60
GFR calc Af Amer: >60
GFR calc Af Amer: >60
GFR calc Af Amer: >60
GFR calc Af Amer: >60
GFR calc Af Amer: >60
GFR calc non Af Amer: >60
GFR calc non Af Amer: >60
GFR calc non Af Amer: >60
GFR calc non Af Amer: >60
Glucose, Bld: 287 — ABNORMAL HIGH
Glucose, Bld: 302 — ABNORMAL HIGH
Glucose, Bld: 108 — ABNORMAL HIGH
Glucose, Bld: 149 — ABNORMAL HIGH
Glucose, Bld: 196 — ABNORMAL HIGH
Glucose, Bld: 207 — ABNORMAL HIGH
Glucose, Bld: 90
Glucose, Bld: 91
Potassium: 3.3 — ABNORMAL LOW
Potassium: 4.1
Potassium: 3.7
Potassium: 3.9
Potassium: 4.1
Potassium: 3.5
Potassium: 3.8
Potassium: 4
Potassium: 4.3
Potassium: 4.5
Sodium: 131 — ABNORMAL LOW
Sodium: 134 — ABNORMAL LOW
Sodium: 139
Sodium: 131 — ABNORMAL LOW
Sodium: 137
Sodium: 138
Sodium: 139
Sodium: 139

## 2011-01-22 LAB — DIFFERENTIAL
Basophils Absolute: 0.1
Basophils Relative: 1
Eosinophils Absolute: 0.1
Eosinophils Relative: 0
Eosinophils Relative: 1
Lymphocytes Relative: 13
Lymphocytes Relative: 17
Lymphs Abs: 1.8
Lymphs Abs: 2
Monocytes Absolute: 1.3 — ABNORMAL HIGH
Monocytes Absolute: 1.6 — ABNORMAL HIGH
Monocytes Relative: 11
Neutro Abs: 8.3 — ABNORMAL HIGH
Neutrophils Relative %: 70

## 2011-01-22 LAB — TSH: TSH: 1.188

## 2011-01-22 LAB — URINE MICROSCOPIC-ADD ON

## 2011-01-22 LAB — CBC
HCT: 42
HCT: 42.8
HCT: 45
Hemoglobin: 14.7
Hemoglobin: 15
Hemoglobin: 15.6 — ABNORMAL HIGH
MCHC: 34.7
MCHC: 35
MCHC: 35.1
MCV: 90.6
MCV: 90.6
MCV: 90.8
Platelets: 270
Platelets: 387
Platelets: 390
Platelets: 225
RBC: 4.63
RBC: 4.72
RBC: 4.96
RDW: 12.9
RDW: 13.2
RDW: 13.3
WBC: 11.9 — ABNORMAL HIGH
WBC: 14.2 — ABNORMAL HIGH
WBC: 14.8 — ABNORMAL HIGH
WBC: 5.7

## 2011-01-22 LAB — URINALYSIS, ROUTINE W REFLEX MICROSCOPIC
Bilirubin Urine: NEGATIVE
Glucose, UA: >1000 — AB
Glucose, UA: 500 — AB
Hgb urine dipstick: NEGATIVE
Hgb urine dipstick: NEGATIVE
Ketones, ur: 15 — AB
Leukocytes, UA: NEGATIVE
Nitrite: NEGATIVE
Protein, ur: 30 — AB
Protein, ur: NEGATIVE
Specific Gravity, Urine: 1.034 — ABNORMAL HIGH
Specific Gravity, Urine: 1.03
Urobilinogen, UA: 0.2
Urobilinogen, UA: 1
pH: 5
pH: 5.5

## 2011-01-22 LAB — URINE CULTURE
Colony Count: NO GROWTH
Colony Count: NO GROWTH
Colony Count: >=100000
Culture: NO GROWTH
Culture: NO GROWTH

## 2011-01-22 LAB — POCT I-STAT 7, (LYTES, BLD GAS, ICA,H+H)
Acid-base deficit: 4 — ABNORMAL HIGH
Bicarbonate: 20.4
Calcium, Ion: 1.11 — ABNORMAL LOW
HCT: 35 — ABNORMAL LOW
Hemoglobin: 11.9 — ABNORMAL LOW
O2 Saturation: 100
Operator id: 300801
Patient temperature: 36.2
Potassium: 4.1
Sodium: 140
TCO2: 21
pCO2 arterial: 34.7 — ABNORMAL LOW
pH, Arterial: 7.374
pO2, Arterial: 218 — ABNORMAL HIGH

## 2011-01-22 LAB — PHENYTOIN LEVEL, TOTAL
Phenytoin Lvl: 11.2
Phenytoin Lvl: 9.7 — ABNORMAL LOW

## 2011-01-22 LAB — PROTIME-INR
INR: 1.1
Prothrombin Time: 14.5

## 2011-01-22 LAB — APTT: aPTT: 33

## 2011-01-22 LAB — COMPREHENSIVE METABOLIC PANEL
ALT: 33
Alkaline Phosphatase: 43
Glucose, Bld: 245 — ABNORMAL HIGH
Potassium: 3.9
Sodium: 129 — ABNORMAL LOW
Total Protein: 6.5

## 2011-04-25 ENCOUNTER — Other Ambulatory Visit: Payer: Self-pay | Admitting: Internal Medicine

## 2011-05-07 ENCOUNTER — Other Ambulatory Visit: Payer: Self-pay | Admitting: Cardiology

## 2011-05-07 DIAGNOSIS — I6529 Occlusion and stenosis of unspecified carotid artery: Secondary | ICD-10-CM

## 2011-05-08 ENCOUNTER — Encounter: Payer: Self-pay | Admitting: Internal Medicine

## 2011-05-08 ENCOUNTER — Encounter (INDEPENDENT_AMBULATORY_CARE_PROVIDER_SITE_OTHER): Payer: Medicare Other | Admitting: *Deleted

## 2011-05-08 ENCOUNTER — Ambulatory Visit (INDEPENDENT_AMBULATORY_CARE_PROVIDER_SITE_OTHER): Payer: Medicare Other | Admitting: Internal Medicine

## 2011-05-08 VITALS — BP 107/64 | HR 66 | Ht 63.0 in | Wt 145.0 lb

## 2011-05-08 DIAGNOSIS — I10 Essential (primary) hypertension: Secondary | ICD-10-CM

## 2011-05-08 DIAGNOSIS — I340 Nonrheumatic mitral (valve) insufficiency: Secondary | ICD-10-CM

## 2011-05-08 DIAGNOSIS — I6529 Occlusion and stenosis of unspecified carotid artery: Secondary | ICD-10-CM

## 2011-05-08 DIAGNOSIS — I509 Heart failure, unspecified: Secondary | ICD-10-CM

## 2011-05-08 DIAGNOSIS — I2510 Atherosclerotic heart disease of native coronary artery without angina pectoris: Secondary | ICD-10-CM

## 2011-05-08 DIAGNOSIS — I059 Rheumatic mitral valve disease, unspecified: Secondary | ICD-10-CM

## 2011-05-08 NOTE — Assessment & Plan Note (Signed)
Good control

## 2011-05-08 NOTE — Assessment & Plan Note (Signed)
Moderate by echo.  Exam is unremarkable.  Follow clinically.

## 2011-05-08 NOTE — Assessment & Plan Note (Signed)
Mild by cath.  No symptms of angina.

## 2011-05-08 NOTE — Assessment & Plan Note (Signed)
Mild LV dysfunction on last echo in 2010.  Volume status looks good.  I would not change regimen.

## 2011-05-08 NOTE — Assessment & Plan Note (Signed)
Stable CV disease.  F/U in 1 year.  Keep on statin.

## 2011-05-08 NOTE — Progress Notes (Signed)
HPI Patient is a 78 year old with ahistory of mild CAD by cath 2011, moderate MR and CHF (LVEF 35%)  I saw her in clinic last in 2012. She also has mild R ICA and moderate L ICA CV disease by [facility]  Stable.   Last echo LVEF wsa 40 to 45% in 2010.  Moderate MR noted. Since seen lipids in March were good. Breathing has been good.  No CP  No edema. Activities  Used to walk but too cool No Known Allergies  Current Outpatient Prescriptions  Medication Sig Dispense Refill  . ALPRAZolam (NIRAVAM) 0.25 MG dissolvable tablet Take 0.25 mg by mouth at bedtime as needed.      . carvedilol (COREG) 6.25 MG tablet Take 6.25 mg by mouth 2 (two) times daily with a meal.      . Cinnamon 500 MG capsule Take 500 mg by mouth daily.      . fish oil-omega-3 fatty acids 1000 MG capsule Take 1 g by mouth daily.      . furosemide (LASIX) 20 MG tablet TAKE ONE TABLET BY MOUTH EVERY DAY  30 tablet  2  . glyBURIDE-metformin (GLUCOVANCE) 5-500 MG per tablet Take 1 tablet by mouth daily with breakfast.      . levothyroxine (SYNTHROID, LEVOTHROID) 100 MCG tablet Take 100 mcg by mouth daily.      Marland Kitchen lisinopril (PRINIVIL,ZESTRIL) 5 MG tablet Take 5 mg by mouth daily.      . Multiple Vitamin (MULTIVITAMIN) tablet Take 1 tablet by mouth daily.      . potassium chloride SA (K-DUR,KLOR-CON) 20 MEQ tablet Take 20 mEq by mouth daily.      . simvastatin (ZOCOR) 80 MG tablet 80 mg. Take 1/2 tab daily        Past Medical History  Diagnosis Date  . Unspecified hypothyroidism   . Diabetes mellitus   . Cerebral cysts   . Essential hypertension, benign   . Carotid artery bruit   . CHF (congestive heart failure)   . Coronary artery disease   . Mitral regurgitation     Past Surgical History  Procedure Date  . Craniotomy     left sided arachnoid    Family History  Problem Relation Age of Onset  . Coronary artery disease Other     History   Social History  . Marital Status: Widowed    Spouse Name: N/A    Number of  Children: N/A  . Years of Education: N/A   Occupational History  . Not on file.   Social History Main Topics  . Smoking status: Never Smoker   . Smokeless tobacco: Not on file  . Alcohol Use:   . Drug Use:   . Sexually Active:    Other Topics Concern  . Not on file   Social History Narrative  . No narrative on file    Review of Systems:  All systems reviewed.  They are negative to the above problem except as previously stated.  Vital Signs: BP 107/64  Pulse 66  Ht 5\' 3"  (1.6 m)  Wt 145 lb (65.772 kg)  BMI 25.69 kg/m2  Physical Exam Patient in NAD  HEENT:  Normocephalic, atraumatic. EOMI, PERRLA.  Neck: JVP is normal. No thyromegaly. L bruit.  Lungs: clear to auscultation. No rales no wheezes.  Heart: Regular rate and rhythm. Normal S1, S2. No S3.   No significant murmurs. PMI not displaced.  Abdomen:  Supple, nontender. Normal bowel sounds. No masses. No hepatomegaly.  Extremities:   Good distal pulses throughout. No lower extremity edema.  Musculoskeletal :moving all extremities.  Neuro:   alert and oriented x3.  CN II-XII grossly intact.  EKG:  Sinus rhythme.  66.  LBBB   Assessment and Plan:

## 2011-05-08 NOTE — Patient Instructions (Signed)
Your physician wants you to follow-up in:  12 months.  You will receive a reminder letter in the mail two months in advance. If you don't receive a letter, please call our office to schedule the follow-up appointment.   

## 2011-05-18 ENCOUNTER — Other Ambulatory Visit: Payer: Self-pay | Admitting: Internal Medicine

## 2011-05-18 NOTE — Telephone Encounter (Signed)
..   Requested Prescriptions   Pending Prescriptions Disp Refills  . KLOR-CON M20 20 MEQ tablet [Pharmacy Med Name: KLOR-CON M20        TAB] 90 each 5    Sig: TAKE ONE TABLET BY MOUTH EVERY DAY  . carvedilol (COREG) 6.25 MG tablet [Pharmacy Med Name: CARVEDILOL 6.25MG    TAB]  11    Sig: TAKE ONE TABLET BY MOUTH TWICE DAILY

## 2011-06-18 ENCOUNTER — Other Ambulatory Visit: Payer: Self-pay

## 2011-06-18 ENCOUNTER — Other Ambulatory Visit: Payer: Self-pay | Admitting: Internal Medicine

## 2011-06-18 MED ORDER — LISINOPRIL 5 MG PO TABS: 5.0000 mg | ORAL_TABLET | Freq: Every day | ORAL | Status: DC

## 2011-11-10 ENCOUNTER — Other Ambulatory Visit: Payer: Self-pay | Admitting: Internal Medicine

## 2011-11-24 ENCOUNTER — Other Ambulatory Visit: Payer: Self-pay | Admitting: Internal Medicine

## 2012-05-12 ENCOUNTER — Other Ambulatory Visit: Payer: Self-pay | Admitting: Internal Medicine

## 2012-05-16 ENCOUNTER — Ambulatory Visit (INDEPENDENT_AMBULATORY_CARE_PROVIDER_SITE_OTHER): Payer: Medicare Other | Admitting: Internal Medicine

## 2012-05-16 VITALS — BP 111/52 | HR 71 | Ht 63.0 in | Wt 151.0 lb

## 2012-05-16 DIAGNOSIS — I10 Essential (primary) hypertension: Secondary | ICD-10-CM

## 2012-05-16 NOTE — Patient Instructions (Signed)
Your physician wants you to follow-up in:  12 months.  You will receive a reminder letter in the mail two months in advance. If you don't receive a letter, please call our office to schedule the follow-up appointment.   

## 2012-05-16 NOTE — Progress Notes (Signed)
HPI Patient is a 79 year old with ahistory of mild CAD by cath 2011, moderate MR and CHF (LVEF 35%) I saw her in clinic last in 2012. She also has mild R ICA and moderate L ICA  Last echo LVEF wsa 40 to 45% in 2010. Moderate MR noted. I saw hier in clinic last January. Since seen she has done well  She denies CP  Breathing is OK   No Known Allergies  Current Outpatient Prescriptions  Medication Sig Dispense Refill  . ALPRAZolam (NIRAVAM) 0.25 MG dissolvable tablet Take 0.25 mg by mouth at bedtime as needed.      . carvedilol (COREG) 6.25 MG tablet TAKE ONE TABLET BY MOUTH TWICE DAILY  60 tablet  4  . Cinnamon 500 MG capsule Take 500 mg by mouth daily.      . fish oil-omega-3 fatty acids 1000 MG capsule Take 1 g by mouth daily.      . furosemide (LASIX) 20 MG tablet TAKE ONE TABLET BY MOUTH EVERY DAY  30 tablet  9  . glyBURIDE-metformin (GLUCOVANCE) 5-500 MG per tablet Take 1 tablet by mouth daily with breakfast.      . KLOR-CON M20 20 MEQ tablet TAKE ONE TABLET BY MOUTH EVERY DAY  90 each  5  . levothyroxine (SYNTHROID, LEVOTHROID) 100 MCG tablet Take 100 mcg by mouth daily.      Marland Kitchen lisinopril (PRINIVIL,ZESTRIL) 5 MG tablet Take 1 tablet (5 mg total) by mouth daily.  30 tablet  10  . Multiple Vitamin (MULTIVITAMIN) tablet Take 1 tablet by mouth daily.      . simvastatin (ZOCOR) 80 MG tablet 80 mg. Take 1/2 tab daily        Past Medical History  Diagnosis Date  . Unspecified hypothyroidism   . Diabetes mellitus   . Cerebral cysts   . Essential hypertension, benign   . Carotid artery bruit   . CHF (congestive heart failure)   . Coronary artery disease   . Mitral regurgitation     Past Surgical History  Procedure Date  . Craniotomy     left sided arachnoid    Family History  Problem Relation Age of Onset  . Coronary artery disease Other     History   Social History  . Marital Status: Widowed    Spouse Name: N/A    Number of Children: N/A  . Years of Education: N/A    Occupational History  . Not on file.   Social History Main Topics  . Smoking status: Never Smoker   . Smokeless tobacco: Not on file  . Alcohol Use:   . Drug Use:   . Sexually Active:    Other Topics Concern  . Not on file   Social History Narrative  . No narrative on file    Review of Systems:  All systems reviewed.  They are negative to the above problem except as previously stated.  Vital Signs: BP 111/52  Pulse 71  Ht 5\' 3"  (1.6 m)  Wt 151 lb (68.493 kg)  BMI 26.75 kg/m2  Physical Exam Patient is in NAD HEENT:  Normocephalic, atraumatic. EOMI, PERRLA.  Neck: JVP is normal.  No bruits.  Lungs: clear to auscultation. No rales no wheezes.  Heart: Regular rate and rhythm. Normal S1, S2. No S3.   No significant murmurs. PMI not displaced.  Abdomen:  Supple, nontender. Normal bowel sounds. No masses. No hepatomegaly.  Extremities:   Good distal pulses throughout. No lower extremity edema.  Musculoskeletal :moving all extremities.  Neuro:   alert and oriented x3.  CN II-XII grossly intact.  EKG  NSR 71 bpm.  LBBB> Assessment and Plan:  1.  CAD  No symptoms of angina  Follow  2.  CHF  Mild dysfunction by echo  I would keep on same regimen  Would not push further with BP  3.  HL  Get labs from Dr Jacky Kindle

## 2012-05-19 ENCOUNTER — Other Ambulatory Visit: Payer: Self-pay | Admitting: Internal Medicine

## 2012-07-13 ENCOUNTER — Encounter (INDEPENDENT_AMBULATORY_CARE_PROVIDER_SITE_OTHER): Payer: Medicare Other

## 2012-07-13 DIAGNOSIS — I6529 Occlusion and stenosis of unspecified carotid artery: Secondary | ICD-10-CM

## 2012-07-23 ENCOUNTER — Other Ambulatory Visit: Payer: Self-pay | Admitting: Internal Medicine

## 2012-10-20 ENCOUNTER — Other Ambulatory Visit: Payer: Self-pay | Admitting: Internal Medicine

## 2013-01-24 ENCOUNTER — Other Ambulatory Visit: Payer: Self-pay | Admitting: Internal Medicine

## 2013-04-03 ENCOUNTER — Other Ambulatory Visit: Payer: Self-pay | Admitting: Internal Medicine

## 2013-05-02 ENCOUNTER — Other Ambulatory Visit: Payer: Self-pay | Admitting: Internal Medicine

## 2013-06-05 ENCOUNTER — Other Ambulatory Visit: Payer: Self-pay | Admitting: Internal Medicine

## 2013-07-06 ENCOUNTER — Other Ambulatory Visit: Payer: Self-pay | Admitting: Internal Medicine

## 2013-07-19 ENCOUNTER — Other Ambulatory Visit (HOSPITAL_COMMUNITY): Payer: Self-pay | Admitting: Cardiology

## 2013-07-19 DIAGNOSIS — I6529 Occlusion and stenosis of unspecified carotid artery: Secondary | ICD-10-CM

## 2013-07-24 ENCOUNTER — Ambulatory Visit (HOSPITAL_COMMUNITY): Payer: Medicare Other | Attending: Cardiology | Admitting: Cardiology

## 2013-07-24 ENCOUNTER — Encounter: Payer: Self-pay | Admitting: Cardiology

## 2013-07-24 DIAGNOSIS — I6529 Occlusion and stenosis of unspecified carotid artery: Secondary | ICD-10-CM | POA: Insufficient documentation

## 2013-07-24 DIAGNOSIS — R0989 Other specified symptoms and signs involving the circulatory and respiratory systems: Secondary | ICD-10-CM

## 2013-07-24 NOTE — Progress Notes (Signed)
Carotid duplex complete 

## 2013-08-02 ENCOUNTER — Other Ambulatory Visit: Payer: Self-pay | Admitting: Internal Medicine

## 2013-08-14 ENCOUNTER — Encounter: Payer: Self-pay | Admitting: Internal Medicine

## 2013-08-30 ENCOUNTER — Other Ambulatory Visit: Payer: Self-pay | Admitting: Internal Medicine

## 2013-08-30 LAB — IFOBT (OCCULT BLOOD): IFOBT: NEGATIVE

## 2013-09-04 ENCOUNTER — Other Ambulatory Visit: Payer: Self-pay | Admitting: Internal Medicine

## 2013-09-14 NOTE — Progress Notes (Signed)
HPI Patient is a 80 year old with ahistory of mild CAD by cath 2011, moderate MR and CHF (LVEF 35%) I saw her in clinic last in 2012. She also has mild R ICA and moderate L ICA  Last echo LVEF wsa 40 to 45% in 2010. Moderate MR noted. I saw hier in clinic Jan 2014 Since seen she has done well  No CP  No SOB  Rare dizziness  No palpitations.   No Known Allergies  Current Outpatient Prescriptions  Medication Sig Dispense Refill  . ALPRAZolam (NIRAVAM) 0.25 MG dissolvable tablet Take 0.25 mg by mouth at bedtime as needed.      . carvedilol (COREG) 6.25 MG tablet TAKE ONE TABLET BY MOUTH TWICE DAILY  30 tablet  0  . Cinnamon 500 MG capsule Take 500 mg by mouth daily.      . fish oil-omega-3 fatty acids 1000 MG capsule Take 1 g by mouth daily.      . furosemide (LASIX) 20 MG tablet TAKE ONE TABLET BY MOUTH EVERY DAY  30 tablet  9  . glyBURIDE (DIABETA) 5 MG tablet Take 5 mg by mouth daily with breakfast.      . levothyroxine (SYNTHROID, LEVOTHROID) 100 MCG tablet Take 100 mcg by mouth daily.      Marland Kitchen. lisinopril (PRINIVIL,ZESTRIL) 5 MG tablet Take 1 tablet (5 mg total) by mouth daily.  30 tablet  5  . metFORMIN (GLUCOPHAGE) 500 MG tablet Take 2 tablets by mouth 2 (two) times daily.      . Multiple Vitamin (MULTIVITAMIN) tablet Take 1 tablet by mouth daily.      . potassium chloride SA (KLOR-CON M20) 20 MEQ tablet TAKE ONE TABLET BY MOUTH ONCE DAILY*      . simvastatin (ZOCOR) 80 MG tablet Take 1/2 tab daily       No current facility-administered medications for this visit.    Past Medical History  Diagnosis Date  . Unspecified hypothyroidism   . Diabetes mellitus   . Cerebral cysts   . Essential hypertension, benign   . Carotid artery bruit   . CHF (congestive heart failure)   . Coronary artery disease   . Mitral regurgitation     Past Surgical History  Procedure Laterality Date  . Craniotomy      left sided arachnoid    Family History  Problem Relation Age of Onset  . Coronary  artery disease Other     History   Social History  . Marital Status: Widowed    Spouse Name: N/A    Number of Children: N/A  . Years of Education: N/A   Occupational History  . Not on file.   Social History Main Topics  . Smoking status: Never Smoker   . Smokeless tobacco: Not on file  . Alcohol Use:   . Drug Use:   . Sexual Activity:    Other Topics Concern  . Not on file   Social History Narrative  . No narrative on file    Review of Systems:  All systems reviewed.  They are negative to the above problem except as previously stated.  Vital Signs: BP 137/51  Pulse 69  Ht 5\' 3"  (1.6 m)  Wt 145 lb (65.772 kg)  BMI 25.69 kg/m2  Physical Exam Patient is in NAD HEENT:  Normocephalic, atraumatic. EOMI, PERRLA.  Neck: JVP is normal.  No bruits.  Lungs: clear to auscultation. No rales no wheezes.  Heart: Regular rate and rhythm. Normal S1, S2.  No S3.   I/VI systolic murmur LLateral postion. PMI not displaced.  Abdomen:  Supple, nontender. Normal bowel sounds. No masses. No hepatomegaly.  Extremities:   Good distal pulses throughout. No lower extremity edema.  Musculoskeletal :moving all extremities.  Neuro:   alert and oriented x3.  CN II-XII grossly intact.  EKG  NSR 69  bpm.  LBBB> Assessment and Plan:  1.  CAD  No symptoms of angina  Follow  2.  CHF  Mild dysfunction by echo  I would keep on same regimen Will get echo  3.  MR  Check with echo  3.  HL  Get labs from Dr Jacky KindleAronson\\  4.  CV disease  Mild plaquing on recent scan  F/U in 1 year

## 2013-09-15 ENCOUNTER — Ambulatory Visit (INDEPENDENT_AMBULATORY_CARE_PROVIDER_SITE_OTHER): Payer: Medicare Other | Admitting: Internal Medicine

## 2013-09-15 ENCOUNTER — Encounter: Payer: Self-pay | Admitting: Internal Medicine

## 2013-09-15 VITALS — BP 137/51 | HR 69 | Ht 63.0 in | Wt 145.0 lb

## 2013-09-15 DIAGNOSIS — I059 Rheumatic mitral valve disease, unspecified: Secondary | ICD-10-CM

## 2013-09-15 NOTE — Patient Instructions (Signed)
Your physician has requested that you have an echocardiogram. Echocardiography is a painless test that uses sound waves to create images of your heart. It provides your doctor with information about the size and shape of your heart and how well your heart's chambers and valves are working. This procedure takes approximately one hour. There are no restrictions for this procedure.  Your physician wants you to follow-up in: 1 YEAR FOLLOW UP WITH DR. Tenny Craw. You will receive a reminder letter in the mail two months in advance. If you don't receive a letter, please call our office to schedule the follow-up appointment.

## 2013-09-19 ENCOUNTER — Telehealth: Payer: Self-pay | Admitting: Cardiology

## 2013-09-19 ENCOUNTER — Encounter: Payer: Self-pay | Admitting: Internal Medicine

## 2013-09-19 NOTE — Telephone Encounter (Signed)
New message ° ° ° ° ° ° ° ° ° °Pt returning nurses call °

## 2013-09-19 NOTE — Telephone Encounter (Signed)
Error

## 2013-09-19 NOTE — Telephone Encounter (Signed)
This encounter was created in error - please disregard.

## 2013-09-25 ENCOUNTER — Other Ambulatory Visit: Payer: Self-pay | Admitting: Internal Medicine

## 2013-10-02 ENCOUNTER — Other Ambulatory Visit: Payer: Self-pay | Admitting: Internal Medicine

## 2013-10-04 ENCOUNTER — Ambulatory Visit (HOSPITAL_COMMUNITY): Payer: Medicare Other | Attending: Cardiology | Admitting: Cardiology

## 2013-10-04 DIAGNOSIS — I509 Heart failure, unspecified: Secondary | ICD-10-CM

## 2013-10-04 DIAGNOSIS — R079 Chest pain, unspecified: Secondary | ICD-10-CM

## 2013-10-04 DIAGNOSIS — I2510 Atherosclerotic heart disease of native coronary artery without angina pectoris: Secondary | ICD-10-CM

## 2013-10-04 DIAGNOSIS — I059 Rheumatic mitral valve disease, unspecified: Secondary | ICD-10-CM

## 2013-10-04 NOTE — Progress Notes (Signed)
Echo performed. 

## 2013-11-02 ENCOUNTER — Other Ambulatory Visit: Payer: Self-pay | Admitting: Internal Medicine

## 2014-01-08 ENCOUNTER — Other Ambulatory Visit: Payer: Self-pay | Admitting: Internal Medicine

## 2014-09-17 ENCOUNTER — Telehealth: Payer: Self-pay

## 2014-09-17 ENCOUNTER — Ambulatory Visit (INDEPENDENT_AMBULATORY_CARE_PROVIDER_SITE_OTHER): Payer: Medicare Other | Admitting: Internal Medicine

## 2014-09-17 ENCOUNTER — Encounter: Payer: Self-pay | Admitting: Internal Medicine

## 2014-09-17 VITALS — BP 128/62 | HR 70 | Ht 63.0 in | Wt 146.0 lb

## 2014-09-17 DIAGNOSIS — I25119 Atherosclerotic heart disease of native coronary artery with unspecified angina pectoris: Secondary | ICD-10-CM | POA: Diagnosis not present

## 2014-09-17 DIAGNOSIS — I10 Essential (primary) hypertension: Secondary | ICD-10-CM

## 2014-09-17 NOTE — Telephone Encounter (Signed)
Spoke with Misty StanleyLisa @ pt pcp. Resent labs requested

## 2014-09-17 NOTE — Patient Instructions (Signed)
Medication Instructions:  Your physician recommends that you continue on your current medications as directed. Please refer to the Current Medication list given to you today.   Labwork: We will request your recent  labs from your primary care physician  Testing/Procedures: None   Follow-Up: Your physician wants you to follow-up in: 1 year with Dr.Ross You will receive a reminder letter in the mail two months in advance. If you don't receive a letter, please call our office to schedule the follow-up appointment.   Any Other Special Instructions Will Be Listed Below (If Applicable).

## 2014-09-17 NOTE — Progress Notes (Addendum)
Cardiology Office Note   Date:  09/17/2014   ID:  [name], DOB 09/26/1933, MRN [id number]  PCP:  Minda Meo, MD  Cardiologist:   Dietrich Pates, MD   No chief complaint on file.    F?U of CAD, CHF    History of Present Illness: [name] is a 82 y.o. female with a history of  HPI Patient is a 81 year old with ahistory of mild CAD by cath 2011, moderate MR and CHF (LVEF 35%) I saw her in clinic last in 2012. She also has mild R ICA and moderate L ICA  Last echo LVEF wsa 40 to 45% in 2010. Moderate MR noted. I saw hier in clinic in May 2015   Echo after showed LVEF 35 to 40%  wtith  Mod MR   Saw Dr Jacky Kindle this month  Breathing good  CP  No  No SOB  Gets winded on exercise bike   Rare dizziness    Current Outpatient Prescriptions  Medication Sig Dispense Refill  . ALPRAZolam (NIRAVAM) 0.25 MG dissolvable tablet Take 0.25 mg by mouth at bedtime as needed.    . carvedilol (COREG) 6.25 MG tablet TAKE ONE TABLET BY MOUTH TWICE DAILY (2 WEEK SUPPLY GIVEN DUE TO PATIENT NEEDING APPOINTMENT) 180 tablet 3  . Cinnamon 500 MG capsule Take 500 mg by mouth daily.    . fish oil-omega-3 fatty acids 1000 MG capsule Take 1 g by mouth daily.    . furosemide (LASIX) 20 MG tablet TAKE ONE TABLET BY MOUTH ONCE DAILY 30 tablet 5  . glyBURIDE (DIABETA) 5 MG tablet Take 5 mg by mouth daily with breakfast.    . levothyroxine (SYNTHROID, LEVOTHROID) 100 MCG tablet Take 100 mcg by mouth daily.    Marland Kitchen lisinopril (PRINIVIL,ZESTRIL) 5 MG tablet TAKE ONE TABLET BY MOUTH ONCE DAILY 30 tablet 3  . metFORMIN (GLUCOPHAGE) 500 MG tablet Take 2 tablets by mouth 2 (two) times daily.    . Multiple Vitamin (MULTIVITAMIN) tablet Take 1 tablet by mouth daily.    . potassium chloride SA (KLOR-CON M20) 20 MEQ tablet TAKE ONE TABLET BY MOUTH ONCE DAILY*    . potassium chloride SA (KLOR-CON M20) 20 MEQ tablet Take 1 tablet (20 mEq total) by mouth daily. 30 tablet 5  . simvastatin (ZOCOR) 80 MG tablet Take 1/2  tab daily     No current facility-administered medications for this visit.    Allergies:   Review of patient's allergies indicates no known allergies.   Past Medical History  Diagnosis Date  . Unspecified hypothyroidism   . Diabetes mellitus   . Cerebral cysts   . Essential hypertension, benign   . Carotid artery bruit   . CHF (congestive heart failure)   . Coronary artery disease   . Mitral regurgitation     Past Surgical History  Procedure Laterality Date  . Craniotomy      left sided arachnoid     Social History:  The patient  reports that she has never smoked. She does not have any smokeless tobacco history on file.   Family History:  The patient's family history includes Coronary artery disease in her other; Diabetes in her father; Emphysema in her father.    ROS:  Please see the history of present illness. All other systems are reviewed and  Negative to the above problem except as noted.    PHYSICAL EXAM: VS:  BP 128/62 mmHg  Pulse 70  Ht  (1.6 m)  Wt 146 lb (66.225 kg)  BMI 25.87 kg/m2  GEN: Well nourished, well developed, in no acute distress HEENT: normal Neck: no JVD, carotid bruits, or masses Cardiac: RRR; no murmurs, rubs, or gallops,no edema  Respiratory:  clear to auscultation bilaterally, normal work of breathing GI: soft, nontender, nondistended, + BS  No hepatomegaly  MS: no deformity Moving all extremities   Skin: warm and dry, no rash Neuro:  Strength and sensation are intact Psych: euthymic mood, full affect   EKG:  EKG is ordered today.  SR 71  Nonspecific IVCD  Sl ST depression II, III, V5/V6   Lipid Panel    Component Value Date/Time   CHOL 109 03/25/2009 1500   TRIG 148.0 03/25/2009 1500   HDL 28.40* 03/25/2009 1500   CHOLHDL 4 03/25/2009 1500   VLDL 29.6 03/25/2009 1500   LDLCALC 51 03/25/2009 1500      Wt Readings from Last 3 Encounters:  09/17/14 146 lb (66.225 kg)  09/15/13 145 lb (65.772 kg)  05/16/12 151 lb  (68.493 kg)      ASSESSMENT AND PLAN:  1.  CAD  No symtoms to sugg angina   2.  CHF  Volume status looks OK   Keep oncurrent regimen  3.  MR  Mod by echo    4.  HL Continue statin  WIll review labs from primary   5  CV disease  Continue statin   F?U in 1 year    Signed, Dietrich PatesPaula Krystin Keeven, MD  09/17/2014 9:59 AM    [facility] [address], [place], [place]  [address] Phone: [phone]; Fax: [phone]

## 2014-09-18 ENCOUNTER — Encounter: Payer: Self-pay | Admitting: Internal Medicine

## 2015-09-18 NOTE — Progress Notes (Signed)
Cardiology Office Note   Date:  09/20/2015   ID:  Jane OhJoann Sneath, DOB 03/16/1934, MRN [id number]  PCP:  Minda MeoARONSON,RICHARD A, MD  Cardiologist:   Dietrich PatesPaula Najeeb Uptain, MD   F?U of CAD and CHF    History of Present Illness: [name] is a 82 y.o. female with a history ofmild CAD by cath 2011, moderate MR and CHF (LVEF 35%) . She also has mild CV dz    Echo after showed LVEF 35 to 40% wtith Mod MR  I saw him in May 2016  Since seen she has done well  No CP  No palipiation  No SOB  No sweling       Outpatient Prescriptions Prior to Visit  Medication Sig Dispense Refill  . ALPRAZolam (NIRAVAM) 0.25 MG dissolvable tablet Take 0.25 mg by mouth at bedtime as needed.    . carvedilol (COREG) 6.25 MG tablet TAKE ONE TABLET BY MOUTH TWICE DAILY (2 WEEK SUPPLY GIVEN DUE TO PATIENT NEEDING APPOINTMENT) 180 tablet 3  . Cinnamon 500 MG capsule Take 500 mg by mouth daily.    . fish oil-omega-3 fatty acids 1000 MG capsule Take 1 g by mouth daily.    . furosemide (LASIX) 20 MG tablet TAKE ONE TABLET BY MOUTH ONCE DAILY 30 tablet 5  . glyBURIDE (DIABETA) 5 MG tablet Take 5 mg by mouth daily with breakfast.    . levothyroxine (SYNTHROID, LEVOTHROID) 100 MCG tablet Take 100 mcg by mouth daily.    Marland Kitchen. lisinopril (PRINIVIL,ZESTRIL) 5 MG tablet TAKE ONE TABLET BY MOUTH ONCE DAILY 30 tablet 3  . metFORMIN (GLUCOPHAGE) 500 MG tablet Take 2 tablets by mouth 2 (two) times daily.    . Multiple Vitamin (MULTIVITAMIN) tablet Take 1 tablet by mouth daily.    . potassium chloride SA (KLOR-CON M20) 20 MEQ tablet TAKE ONE TABLET BY MOUTH ONCE DAILY*    . potassium chloride SA (KLOR-CON M20) 20 MEQ tablet Take 1 tablet (20 mEq total) by mouth daily. 30 tablet 5  . simvastatin (ZOCOR) 80 MG tablet Take 1/2 tab daily     No facility-administered medications prior to visit.     Allergies:   Review of patient's allergies indicates no known allergies.   Past Medical History  Diagnosis Date  . Unspecified hypothyroidism     . Diabetes mellitus   . Cerebral cysts   . Essential hypertension, benign   . Carotid artery bruit   . CHF (congestive heart failure) (HCC)   . Coronary artery disease   . Mitral regurgitation     Past Surgical History  Procedure Laterality Date  . Craniotomy      left sided arachnoid     Social History:  The patient  reports that she has never smoked. She does not have any smokeless tobacco history on file.   Family History:  The patient's family history includes Coronary artery disease in her other; Diabetes in her father; Emphysema in her father.    ROS:  Please see the history of present illness. All other systems are reviewed and  Negative to the above problem except as noted.    PHYSICAL EXAM: VS:  BP 128/56 mmHg  Pulse 73  Ht 5\' 3"  (1.6 m)  Wt 148 lb 3.2 oz (67.223 kg)  BMI 26.26 kg/m2  GEN: Well nourished, well developed, in no acute distress HEENT: normal Neck: no JVD Cardiac: RRR; no murmurs, rubs, or gallops,no edema  Respiratory:  clear to auscultation bilaterally, normal work of breathing  GI: soft, nontender, nondistended, + BS  No hepatomegaly  MS: no deformity Moving all extremities   Skin: warm and dry, no rash Neuro:  Strength and sensation are intact Psych: euthymic mood, full affect   EKG:  EKG is ordered today.  SR  73 bpm  Nonspecific IVCD   Lipid Panel    Component Value Date/Time   CHOL 109 03/25/2009 1500   TRIG 148.0 03/25/2009 1500   HDL 28.40* 03/25/2009 1500   CHOLHDL 4 03/25/2009 1500   VLDL 29.6 03/25/2009 1500   LDLCALC 51 03/25/2009 1500      Wt Readings from Last 3 Encounters:  09/20/15 148 lb 3.2 oz (67.223 kg)  09/17/14 146 lb (66.225 kg)  09/15/13 145 lb (65.772 kg)      ASSESSMENT AND PLAN: 1  CAD No symptoms of angina  Follow    2.  CHF  Volumue is good  Get labs from Dr Patty Sermons office    3.  MR  Will follow clinically for now.  Mod MR on echo in 2015  4.  HL Get labs  Continue statin    5 CV dz  Mild dz  by carotid USN in March 2015  Would keep on medical Rx    Signed, Dietrich Pates, MD  09/20/2015 9:21 AM    [facility] [address] [place], [place], [place]  [address] Phone: [phone]; Fax: [phone]

## 2015-09-20 ENCOUNTER — Encounter: Payer: Self-pay | Admitting: Internal Medicine

## 2015-09-20 ENCOUNTER — Ambulatory Visit (INDEPENDENT_AMBULATORY_CARE_PROVIDER_SITE_OTHER): Payer: Medicare Other | Admitting: Internal Medicine

## 2015-09-20 VITALS — BP 128/56 | HR 73 | Ht 63.0 in | Wt 148.2 lb

## 2015-09-20 DIAGNOSIS — I10 Essential (primary) hypertension: Secondary | ICD-10-CM

## 2015-09-20 NOTE — Patient Instructions (Signed)
Your physician recommends that you continue on your current medications as directed. Please refer to the Current Medication list given to you today. Your physician wants you to follow-up in: 1 YEAR WITH DR. ROSS.  You will receive a reminder letter in the mail two months in advance. If you don't receive a letter, please call our office to schedule the follow-up appointment.  

## 2016-05-06 DIAGNOSIS — E11319 Type 2 diabetes mellitus with unspecified diabetic retinopathy without macular edema: Secondary | ICD-10-CM | POA: Diagnosis not present

## 2016-05-06 DIAGNOSIS — I252 Old myocardial infarction: Secondary | ICD-10-CM | POA: Diagnosis not present

## 2016-05-06 DIAGNOSIS — I509 Heart failure, unspecified: Secondary | ICD-10-CM | POA: Diagnosis not present

## 2016-05-06 DIAGNOSIS — E1139 Type 2 diabetes mellitus with other diabetic ophthalmic complication: Secondary | ICD-10-CM | POA: Diagnosis not present

## 2016-05-06 DIAGNOSIS — R002 Palpitations: Secondary | ICD-10-CM | POA: Diagnosis not present

## 2016-05-06 DIAGNOSIS — E038 Other specified hypothyroidism: Secondary | ICD-10-CM | POA: Diagnosis not present

## 2016-05-06 DIAGNOSIS — R69 Illness, unspecified: Secondary | ICD-10-CM | POA: Diagnosis not present

## 2016-05-06 DIAGNOSIS — I10 Essential (primary) hypertension: Secondary | ICD-10-CM | POA: Diagnosis not present

## 2016-05-06 DIAGNOSIS — I700 Atherosclerosis of aorta: Secondary | ICD-10-CM | POA: Diagnosis not present

## 2016-05-06 DIAGNOSIS — E1151 Type 2 diabetes mellitus with diabetic peripheral angiopathy without gangrene: Secondary | ICD-10-CM | POA: Diagnosis not present

## 2016-07-17 DIAGNOSIS — R69 Illness, unspecified: Secondary | ICD-10-CM | POA: Diagnosis not present

## 2016-08-31 DIAGNOSIS — H8112 Benign paroxysmal vertigo, left ear: Secondary | ICD-10-CM | POA: Diagnosis not present

## 2016-08-31 DIAGNOSIS — E11319 Type 2 diabetes mellitus with unspecified diabetic retinopathy without macular edema: Secondary | ICD-10-CM | POA: Diagnosis not present

## 2016-08-31 DIAGNOSIS — I700 Atherosclerosis of aorta: Secondary | ICD-10-CM | POA: Diagnosis not present

## 2016-08-31 DIAGNOSIS — R69 Illness, unspecified: Secondary | ICD-10-CM | POA: Diagnosis not present

## 2016-08-31 DIAGNOSIS — I739 Peripheral vascular disease, unspecified: Secondary | ICD-10-CM | POA: Diagnosis not present

## 2016-08-31 DIAGNOSIS — I252 Old myocardial infarction: Secondary | ICD-10-CM | POA: Diagnosis not present

## 2016-08-31 DIAGNOSIS — E1139 Type 2 diabetes mellitus with other diabetic ophthalmic complication: Secondary | ICD-10-CM | POA: Diagnosis not present

## 2016-08-31 DIAGNOSIS — R002 Palpitations: Secondary | ICD-10-CM | POA: Diagnosis not present

## 2016-08-31 DIAGNOSIS — E1151 Type 2 diabetes mellitus with diabetic peripheral angiopathy without gangrene: Secondary | ICD-10-CM | POA: Diagnosis not present

## 2016-08-31 DIAGNOSIS — Z1389 Encounter for screening for other disorder: Secondary | ICD-10-CM | POA: Diagnosis not present

## 2016-09-17 DIAGNOSIS — Z1231 Encounter for screening mammogram for malignant neoplasm of breast: Secondary | ICD-10-CM | POA: Diagnosis not present

## 2016-09-17 DIAGNOSIS — Z01419 Encounter for gynecological examination (general) (routine) without abnormal findings: Secondary | ICD-10-CM | POA: Diagnosis not present

## 2016-09-17 DIAGNOSIS — Z6826 Body mass index (BMI) 26.0-26.9, adult: Secondary | ICD-10-CM | POA: Diagnosis not present

## 2016-10-15 ENCOUNTER — Encounter: Payer: Self-pay | Admitting: Internal Medicine

## 2016-10-15 DIAGNOSIS — R69 Illness, unspecified: Secondary | ICD-10-CM | POA: Diagnosis not present

## 2016-10-31 NOTE — Progress Notes (Signed)
Cardiology Office Note   Date:  11/02/2016   ID:  Jane OhJoann Scobee, DOB 05/17/1933, MRN [id number]  PCP:  Geoffry ParadiseAronson, Richard, MD  Cardiologist:   Dietrich PatesPaula Lethia Donlon, MD   F?U of CAD and CHF    History of Present Illness: [name] is a 83 y.o. female with a history of mild CAD by cath 2011, moderate MR and CHF (LVEF 35%) . She also has mild CV dz    Echo after showed LVEF 35 to 40% wtith Mod MR  I saw him in May 2017  Pt has had some heart racing at times  Few seconds   Her breathing is OK   Notes occasional dizziness with standing or if moves neck around    Outpatient Medications Prior to Visit  Medication Sig Dispense Refill  . ALPRAZolam (NIRAVAM) 0.25 MG dissolvable tablet Take 0.25 mg by mouth at bedtime as needed.    . carvedilol (COREG) 6.25 MG tablet TAKE ONE TABLET BY MOUTH TWICE DAILY (2 WEEK SUPPLY GIVEN DUE TO PATIENT NEEDING APPOINTMENT) 180 tablet 3  . Cinnamon 500 MG capsule Take 500 mg by mouth daily.    . fish oil-omega-3 fatty acids 1000 MG capsule Take 1 g by mouth daily.    . furosemide (LASIX) 20 MG tablet TAKE ONE TABLET BY MOUTH ONCE DAILY 30 tablet 5  . glyBURIDE (DIABETA) 5 MG tablet Take 5 mg by mouth daily with breakfast.    . levothyroxine (SYNTHROID, LEVOTHROID) 100 MCG tablet Take 100 mcg by mouth daily.    Marland Kitchen. lisinopril (PRINIVIL,ZESTRIL) 5 MG tablet TAKE ONE TABLET BY MOUTH ONCE DAILY 30 tablet 3  . metFORMIN (GLUCOPHAGE) 500 MG tablet Take 2 tablets by mouth 2 (two) times daily.    . Multiple Vitamin (MULTIVITAMIN) tablet Take 1 tablet by mouth daily.    . potassium chloride SA (KLOR-CON M20) 20 MEQ tablet Take 1 tablet (20 mEq total) by mouth daily. 30 tablet 5  . simvastatin (ZOCOR) 80 MG tablet Take 1/2 tab daily    . potassium chloride SA (KLOR-CON M20) 20 MEQ tablet TAKE ONE TABLET BY MOUTH ONCE DAILY*     No facility-administered medications prior to visit.      Allergies:   Patient has no known allergies.   Past Medical History:  Diagnosis  Date  . Carotid artery bruit   . Cerebral cysts   . CHF (congestive heart failure) (HCC)   . Coronary artery disease   . Diabetes mellitus   . Essential hypertension, benign   . Mitral regurgitation   . Unspecified hypothyroidism     Past Surgical History:  Procedure Laterality Date  . CRANIOTOMY     left sided arachnoid     Social History:  The patient  reports that she has never smoked. She has never used smokeless tobacco. She reports that she does not drink alcohol or use drugs.   Family History:  The patient's family history includes Coronary artery disease in her other; Diabetes in her father; Emphysema in her father.    ROS:  Please see the history of present illness. All other systems are reviewed and  Negative to the above problem except as noted.    PHYSICAL EXAM: VS:  BP 114/64   Pulse 81   Ht 5\' 3"  (1.6 m)   Wt 65.3 kg (144 lb)   BMI 25.51 kg/m   GEN: Well nourished, well developed, in no acute distress  HEENT: normal  Neck: no JVD Cardiac: RRR; no  murmurs, rubs, or gallops,no edema  Respiratory:  clear to auscultation bilaterally, normal work of breathing GI: soft, nontender, nondistended, + BS  No hepatomegaly  MS: no deformity Moving all extremities   Skin: warm and dry, no rash Neuro:  Strength and sensation are intact Psych: euthymic mood, full affect  EKG  SR  81  LBBB  Lipid Panel    Component Value Date/Time   CHOL 109 03/25/2009 1500   TRIG 148.0 03/25/2009 1500   HDL 28.40 (L) 03/25/2009 1500   CHOLHDL 4 03/25/2009 1500   VLDL 29.6 03/25/2009 1500   LDLCALC 51 03/25/2009 1500      Wt Readings from Last 3 Encounters:  11/02/16 65.3 kg (144 lb)  09/20/15 67.2 kg (148 lb 3.2 oz)  09/17/14 66.2 kg (146 lb)      ASSESSMENT AND PLAN: 1  CAD  No symptoms to sugg angina    2.  CHF Volume status is good  I am reluctant to change meds  Worried may make BP go too low  Already has occasional dizznss  Follow    3. Palpiitations  Short  lived  I am not convinced of any significant arrhythmias  4   MR  Last echo in 2015  Moderate MR at time  Breathing OK  I would not plan repeat echo for now.   4.  HL  Continue statin  Will get labs from Dr Lanell Matar office 5 CV  Mild plaquing of carotids on USN in 2015  F/U in 1 year    Signed, Dietrich Pates, MD  11/02/2016 10:53 AM    [facility] [address] [place], [place], [place]  [address] Phone: [phone]; Fax: [phone]

## 2016-11-02 ENCOUNTER — Encounter: Payer: Self-pay | Admitting: Internal Medicine

## 2016-11-02 ENCOUNTER — Ambulatory Visit (INDEPENDENT_AMBULATORY_CARE_PROVIDER_SITE_OTHER): Payer: Medicare HMO | Admitting: Internal Medicine

## 2016-11-02 VITALS — BP 114/64 | HR 81 | Ht 63.0 in | Wt 144.0 lb

## 2016-11-02 DIAGNOSIS — R002 Palpitations: Secondary | ICD-10-CM

## 2016-11-02 DIAGNOSIS — E782 Mixed hyperlipidemia: Secondary | ICD-10-CM | POA: Diagnosis not present

## 2016-11-02 DIAGNOSIS — I5022 Chronic systolic (congestive) heart failure: Secondary | ICD-10-CM | POA: Diagnosis not present

## 2016-11-02 DIAGNOSIS — I340 Nonrheumatic mitral (valve) insufficiency: Secondary | ICD-10-CM

## 2016-11-02 NOTE — Patient Instructions (Signed)
Your physician recommends that you continue on your current medications as directed. Please refer to the Current Medication list given to you today.  Your physician wants you to follow-up in: 1 YEAR WITH DR. Tenny CrawOSS.  You will receive a reminder letter in the mail two months in advance. If you don't receive a letter, please call our office to schedule the follow-up appointment.  PLEASE ASK DR. Jacky KindleARONSON TO SEND A COPY OF YOUR LABS TO DR. Tenny CrawOSS.  FAX: [phone].

## 2016-11-04 DIAGNOSIS — E1151 Type 2 diabetes mellitus with diabetic peripheral angiopathy without gangrene: Secondary | ICD-10-CM | POA: Diagnosis not present

## 2016-11-04 DIAGNOSIS — I10 Essential (primary) hypertension: Secondary | ICD-10-CM | POA: Diagnosis not present

## 2016-11-04 DIAGNOSIS — E038 Other specified hypothyroidism: Secondary | ICD-10-CM | POA: Diagnosis not present

## 2016-11-11 DIAGNOSIS — I700 Atherosclerosis of aorta: Secondary | ICD-10-CM | POA: Diagnosis not present

## 2016-11-11 DIAGNOSIS — E1151 Type 2 diabetes mellitus with diabetic peripheral angiopathy without gangrene: Secondary | ICD-10-CM | POA: Diagnosis not present

## 2016-11-11 DIAGNOSIS — E11319 Type 2 diabetes mellitus with unspecified diabetic retinopathy without macular edema: Secondary | ICD-10-CM | POA: Diagnosis not present

## 2016-11-11 DIAGNOSIS — Z0000 Encounter for general adult medical examination without abnormal findings: Secondary | ICD-10-CM | POA: Diagnosis not present

## 2016-11-11 DIAGNOSIS — E038 Other specified hypothyroidism: Secondary | ICD-10-CM | POA: Diagnosis not present

## 2016-11-11 DIAGNOSIS — E784 Other hyperlipidemia: Secondary | ICD-10-CM | POA: Diagnosis not present

## 2016-11-11 DIAGNOSIS — E1139 Type 2 diabetes mellitus with other diabetic ophthalmic complication: Secondary | ICD-10-CM | POA: Diagnosis not present

## 2016-11-11 DIAGNOSIS — H8112 Benign paroxysmal vertigo, left ear: Secondary | ICD-10-CM | POA: Diagnosis not present

## 2016-11-11 DIAGNOSIS — I10 Essential (primary) hypertension: Secondary | ICD-10-CM | POA: Diagnosis not present

## 2016-11-11 DIAGNOSIS — I252 Old myocardial infarction: Secondary | ICD-10-CM | POA: Diagnosis not present

## 2016-11-12 DIAGNOSIS — Z1212 Encounter for screening for malignant neoplasm of rectum: Secondary | ICD-10-CM | POA: Diagnosis not present

## 2016-11-20 DIAGNOSIS — Z1212 Encounter for screening for malignant neoplasm of rectum: Secondary | ICD-10-CM | POA: Diagnosis not present

## 2017-01-02 DIAGNOSIS — R69 Illness, unspecified: Secondary | ICD-10-CM | POA: Diagnosis not present

## 2017-03-04 DIAGNOSIS — E1139 Type 2 diabetes mellitus with other diabetic ophthalmic complication: Secondary | ICD-10-CM | POA: Diagnosis not present

## 2017-03-04 DIAGNOSIS — E7849 Other hyperlipidemia: Secondary | ICD-10-CM | POA: Diagnosis not present

## 2017-03-04 DIAGNOSIS — I739 Peripheral vascular disease, unspecified: Secondary | ICD-10-CM | POA: Diagnosis not present

## 2017-03-04 DIAGNOSIS — E11319 Type 2 diabetes mellitus with unspecified diabetic retinopathy without macular edema: Secondary | ICD-10-CM | POA: Diagnosis not present

## 2017-03-04 DIAGNOSIS — I10 Essential (primary) hypertension: Secondary | ICD-10-CM | POA: Diagnosis not present

## 2017-03-04 DIAGNOSIS — I700 Atherosclerosis of aorta: Secondary | ICD-10-CM | POA: Diagnosis not present

## 2017-03-04 DIAGNOSIS — R002 Palpitations: Secondary | ICD-10-CM | POA: Diagnosis not present

## 2017-03-04 DIAGNOSIS — E1151 Type 2 diabetes mellitus with diabetic peripheral angiopathy without gangrene: Secondary | ICD-10-CM | POA: Diagnosis not present

## 2017-03-04 DIAGNOSIS — I252 Old myocardial infarction: Secondary | ICD-10-CM | POA: Diagnosis not present

## 2017-03-04 DIAGNOSIS — R69 Illness, unspecified: Secondary | ICD-10-CM | POA: Diagnosis not present

## 2017-04-04 DIAGNOSIS — R69 Illness, unspecified: Secondary | ICD-10-CM | POA: Diagnosis not present

## 2017-06-11 DIAGNOSIS — R69 Illness, unspecified: Secondary | ICD-10-CM | POA: Diagnosis not present

## 2017-06-24 ENCOUNTER — Other Ambulatory Visit: Payer: Self-pay

## 2017-06-24 ENCOUNTER — Emergency Department (HOSPITAL_COMMUNITY)
Admission: EM | Admit: 2017-06-24 | Discharge: 2017-07-26 | Disposition: E | Payer: Medicare HMO | Attending: Emergency Medicine | Admitting: Emergency Medicine

## 2017-06-24 ENCOUNTER — Encounter (HOSPITAL_COMMUNITY): Payer: Self-pay

## 2017-06-24 DIAGNOSIS — Z8679 Personal history of other diseases of the circulatory system: Secondary | ICD-10-CM | POA: Diagnosis not present

## 2017-06-24 DIAGNOSIS — E039 Hypothyroidism, unspecified: Secondary | ICD-10-CM | POA: Insufficient documentation

## 2017-06-24 DIAGNOSIS — I462 Cardiac arrest due to underlying cardiac condition: Secondary | ICD-10-CM | POA: Diagnosis not present

## 2017-06-24 DIAGNOSIS — Z7984 Long term (current) use of oral hypoglycemic drugs: Secondary | ICD-10-CM | POA: Diagnosis not present

## 2017-06-24 DIAGNOSIS — I10 Essential (primary) hypertension: Secondary | ICD-10-CM | POA: Diagnosis not present

## 2017-06-24 DIAGNOSIS — I2510 Atherosclerotic heart disease of native coronary artery without angina pectoris: Secondary | ICD-10-CM | POA: Diagnosis not present

## 2017-06-24 DIAGNOSIS — E119 Type 2 diabetes mellitus without complications: Secondary | ICD-10-CM | POA: Insufficient documentation

## 2017-06-24 DIAGNOSIS — I469 Cardiac arrest, cause unspecified: Secondary | ICD-10-CM | POA: Diagnosis not present

## 2017-06-24 DIAGNOSIS — Z79899 Other long term (current) drug therapy: Secondary | ICD-10-CM | POA: Insufficient documentation

## 2017-06-24 NOTE — ED Notes (Addendum)
Family directed to consultation A

## 2017-06-24 NOTE — ED Triage Notes (Addendum)
Per EMS, pt had witnessed cardiac arrest at 2135, 2136 CPR was started by [employer] de[employer]. ROSC achieved at 2139, 2144 CPR restarted and pt has been asystole/pea since then. Pt has received 8 epis and is intubated. Pt has IV in left AC and IO in right tibia. CPR still in process upon arrival here, pt is pulseless and apneic.

## 2017-06-24 NOTE — ED Notes (Signed)
This RN spoke with [name] donor. Pt is only suitable for tissue donation. Referral # [id number]. [name] [phone] option 1. She needs next of kin and phone number/address.

## 2017-06-24 NOTE — ED Notes (Addendum)
Time of death 2245, called by Dr Particia NearingHaviland

## 2017-06-24 NOTE — ED Provider Notes (Signed)
[facility] EMERGENCY DEPARTMENT Provider Note   CSN: [id number] Arrival date & time: 25-Jun-2017  2243     History   Chief Complaint Chief Complaint  Patient presents with  . Cardiac Arrest    HPI [name] is a 84 y.o. female.  Pt presents to the ED today in full cardiac arrest.  Pt had a witnessed arrest at 2135.  CPR started by fire department at 2136.  ? ROSC at 2139.  She lost pulses at 2144 and CPR was started and she's been in asystole and PEA since then.  Pt was orally intubated by EMS with a 7.0 ETT.  Pt received 8 epis and was put on an epi drip.    Per family, she lost a son a week ago and has had a cold for the past few days.  She has not been c/o anything hurting.      Past Medical History:  Diagnosis Date  . Carotid artery bruit   . Cerebral cysts   . CHF (congestive heart failure) (HCC)   . Coronary artery disease   . Diabetes mellitus   . Essential hypertension, benign   . Mitral regurgitation   . Unspecified hypothyroidism     Patient Active Problem List   Diagnosis Date Noted  . Mitral regurgitation 05/08/2011  . CAD (coronary artery disease) 05/08/2011  . CAROTID OCCLUSIVE DISEASE 03/31/2010  . FLANK PAIN 03/25/2009  . CHF 09/13/2008  . CHEST PAIN UNSPECIFIED 08/27/2008  . HYPERLIPIDEMIA-MIXED 08/02/2008  . MURMUR 08/02/2008  . ABNORMAL HEART SOUNDS 08/02/2008  . BRUIT 08/02/2008  . ABNORMAL EKG 08/02/2008  . UNSPECIFIED HYPOTHYROIDISM 08/01/2008  . ARACHNOID CYST 08/01/2008  . ESSENTIAL HYPERTENSION, BENIGN 08/01/2008    Past Surgical History:  Procedure Laterality Date  . CRANIOTOMY     left sided arachnoid    OB History    No data available       Home Medications    Prior to Admission medications   Medication Sig Start Date End Date Taking? Authorizing Provider  ALPRAZolam (NIRAVAM) 0.25 MG dissolvable tablet Take 0.25 mg by mouth at bedtime as needed.    [provider]  carvedilol (COREG) 6.25  MG tablet TAKE ONE TABLET BY MOUTH TWICE DAILY (2 WEEK SUPPLY GIVEN DUE TO PATIENT NEEDING APPOINTMENT)    Pricilla Riffle, MD  Cinnamon 500 MG capsule Take 500 mg by mouth daily.    [provider]  fish oil-omega-3 fatty acids 1000 MG capsule Take 1 g by mouth daily.    [provider]  furosemide (LASIX) 20 MG tablet TAKE ONE TABLET BY MOUTH ONCE DAILY 11/02/13   Pricilla Riffle, MD  glyBURIDE (DIABETA) 5 MG tablet Take 5 mg by mouth daily with breakfast.    [provider]  levothyroxine (SYNTHROID, LEVOTHROID) 100 MCG tablet Take 100 mcg by mouth daily.    [provider]  lisinopril (PRINIVIL,ZESTRIL) 5 MG tablet TAKE ONE TABLET BY MOUTH ONCE DAILY 01/09/14   Pricilla Riffle, MD  metFORMIN (GLUCOPHAGE) 500 MG tablet Take 2 tablets by mouth 2 (two) times daily. 08/02/13   [provider]  Multiple Vitamin (MULTIVITAMIN) tablet Take 1 tablet by mouth daily.    [provider]  potassium chloride SA (KLOR-CON M20) 20 MEQ tablet Take 1 tablet (20 mEq total) by mouth daily.    Pricilla Riffle, MD  simvastatin (ZOCOR) 80 MG tablet Take 1/2 tab daily    [provider]  Family History Family History  Problem Relation Age of Onset  . Coronary artery disease Other   . Diabetes Father   . Emphysema Father     Social History Social History   Tobacco Use  . Smoking status: Never Smoker  . Smokeless tobacco: Never Used  Substance Use Topics  . Alcohol use: No  . Drug use: No     Allergies   Patient has no known allergies.   Review of Systems Review of Systems  Unable to perform ROS: Patient unresponsive     Physical Exam Updated Vital Signs Wt 65.3 kg (144 lb)   BMI 25.51 kg/m   Physical Exam  Constitutional: She appears distressed.  HENT:  Head: Normocephalic and atraumatic.  Right Ear: External ear normal.  Left Ear: External ear normal.  Nose: Nose normal.  Orally intubated with 7.0 ETT  Eyes: Conjunctivae and  EOM are normal. Pupils are equal, round, and reactive to light.  Neck: Normal range of motion. Neck supple.  Cardiovascular:  No HR  Pulmonary/Chest:  No spontaneous resp.  Good BS with BVM  Abdominal: Soft. Bowel sounds are normal.  Neurological: She is unresponsive.  Skin: Skin is warm.  Nursing note and vitals reviewed.    ED Treatments / Results  Labs (all labs ordered are listed, but only abnormal results are displayed) Labs Reviewed - No data to display  EKG  EKG Interpretation None       Radiology No results found.  Procedures Procedures (including critical care time)  Medications Ordered in ED Medications - No data to display   Initial Impression / Assessment and Plan / ED Course  I have reviewed the triage vital signs and the nursing notes.  Pertinent labs & imaging results that were available during my care of the patient were reviewed by me and considered in my medical decision making (see chart for details).    Pt has been down for 1 hour.   No spontaneous HR or RR.   I did a bedside US of pt's heart when she arrived and there was no movement.  TOD 2245.  [facility]  Death certificate to Dr. Jacky KindleAronson and he will sign.   Pt's family notified and questions answered.  Final Clinical Impressions(s) / ED Diagnoses   Final diagnoses:  Cardiac arrest [facility](HCC)    ED Discharge Orders    None       Jacalyn LefevreHaviland, Shakia Sebastiano, MD 05/30/2017 2305

## 2017-06-25 DEATH — deceased

## 2017-07-26 NOTE — Progress Notes (Signed)
   2017/07/19 0000  Clinical Encounter Type  Visited With Patient and family together  Visit Type Death  Referral From Nurse  Consult/Referral To Chaplain  Spiritual Encounters  Spiritual Needs Prayer;Other (Comment) (Getting information of her son's contact number)  Stress Factors  Patient Stress Factors None identified  Family Stress Factors None identified    [name] passed away, and I met her son. I gave information to charge nurse; Son's name, contact number, and address, etc.  I pray for family and [name].   Chaplain Intern Hollie Arroyo

## 2017-07-26 DEATH — deceased
